# Patient Record
Sex: Male | Born: 1975 | Race: White | Hispanic: No | Marital: Married | Smoking: Current every day smoker
Health system: Southern US, Community
[De-identification: ages and names within clinical notes are randomized; demographics above are authoritative.]

## PROBLEM LIST (undated history)

## (undated) DIAGNOSIS — F419 Anxiety disorder, unspecified: Secondary | ICD-10-CM

## (undated) DIAGNOSIS — IMO0002 Reserved for concepts with insufficient information to code with codable children: Secondary | ICD-10-CM

## (undated) DIAGNOSIS — M1990 Unspecified osteoarthritis, unspecified site: Secondary | ICD-10-CM

## (undated) DIAGNOSIS — I209 Angina pectoris, unspecified: Secondary | ICD-10-CM

## (undated) DIAGNOSIS — K219 Gastro-esophageal reflux disease without esophagitis: Secondary | ICD-10-CM

## (undated) DIAGNOSIS — I10 Essential (primary) hypertension: Secondary | ICD-10-CM

## (undated) HISTORY — DX: Gastro-esophageal reflux disease without esophagitis: K21.9

## (undated) HISTORY — PX: LUMBAR FUSION: SHX111

## (undated) HISTORY — PX: CARDIAC CATHETERIZATION: SHX172

## (undated) HISTORY — PX: BACK SURGERY: SHX140

## (undated) HISTORY — DX: Reserved for concepts with insufficient information to code with codable children: IMO0002

## (undated) HISTORY — PX: SPINE SURGERY: SHX786

---

## 2008-01-28 ENCOUNTER — Emergency Department: Payer: Self-pay | Admitting: Internal Medicine

## 2009-07-20 ENCOUNTER — Other Ambulatory Visit: Payer: Self-pay | Admitting: Family

## 2009-11-12 ENCOUNTER — Emergency Department: Payer: Self-pay | Admitting: Emergency Medicine

## 2010-03-11 ENCOUNTER — Emergency Department: Payer: Self-pay | Admitting: Emergency Medicine

## 2010-04-14 DIAGNOSIS — IMO0002 Reserved for concepts with insufficient information to code with codable children: Secondary | ICD-10-CM

## 2010-04-14 HISTORY — DX: Reserved for concepts with insufficient information to code with codable children: IMO0002

## 2010-08-12 ENCOUNTER — Encounter (HOSPITAL_COMMUNITY)
Admission: RE | Admit: 2010-08-12 | Discharge: 2010-08-12 | Disposition: A | Discharge status: Home / Self Care | Payer: Worker's Compensation | Source: Ambulatory Visit | Attending: Orthopedic Surgery | Admitting: Orthopedic Surgery

## 2010-08-12 ENCOUNTER — Other Ambulatory Visit (HOSPITAL_COMMUNITY): Payer: Self-pay | Admitting: Orthopedic Surgery

## 2010-08-12 ENCOUNTER — Ambulatory Visit (HOSPITAL_COMMUNITY)
Admission: RE | Admit: 2010-08-12 | Discharge: 2010-08-12 | Disposition: A | Discharge status: Home / Self Care | Payer: Worker's Compensation | Source: Ambulatory Visit | Attending: Orthopedic Surgery | Admitting: Orthopedic Surgery

## 2010-08-12 DIAGNOSIS — Z01812 Encounter for preprocedural laboratory examination: Secondary | ICD-10-CM | POA: Insufficient documentation

## 2010-08-12 DIAGNOSIS — Z01818 Encounter for other preprocedural examination: Secondary | ICD-10-CM

## 2010-08-12 DIAGNOSIS — M5137 Other intervertebral disc degeneration, lumbosacral region: Secondary | ICD-10-CM | POA: Insufficient documentation

## 2010-08-12 DIAGNOSIS — M51379 Other intervertebral disc degeneration, lumbosacral region without mention of lumbar back pain or lower extremity pain: Secondary | ICD-10-CM | POA: Insufficient documentation

## 2010-08-12 LAB — CBC
MCV: 87.2 fL (ref 78.0–100.0)
Platelets: 287 10*3/uL (ref 150–400)
RBC: 4.7 MIL/uL (ref 4.22–5.81)
RDW: 12.7 % (ref 11.5–15.5)
WBC: 9.3 10*3/uL (ref 4.0–10.5)

## 2010-08-12 LAB — SURGICAL PCR SCREEN
MRSA, PCR: NEGATIVE
Staphylococcus aureus: NEGATIVE

## 2010-08-14 ENCOUNTER — Observation Stay (HOSPITAL_COMMUNITY)
Admission: RE | Admit: 2010-08-14 | Discharge: 2010-08-15 | Disposition: A | Discharge status: Home / Self Care | Payer: Worker's Compensation | Source: Ambulatory Visit | Attending: Orthopedic Surgery | Admitting: Orthopedic Surgery

## 2010-08-14 ENCOUNTER — Ambulatory Visit (HOSPITAL_COMMUNITY): Payer: Worker's Compensation

## 2010-08-14 DIAGNOSIS — F17200 Nicotine dependence, unspecified, uncomplicated: Secondary | ICD-10-CM | POA: Insufficient documentation

## 2010-08-14 DIAGNOSIS — K219 Gastro-esophageal reflux disease without esophagitis: Secondary | ICD-10-CM | POA: Insufficient documentation

## 2010-08-14 DIAGNOSIS — M5126 Other intervertebral disc displacement, lumbar region: Principal | ICD-10-CM | POA: Insufficient documentation

## 2010-08-21 NOTE — Op Note (Signed)
[name], [name]                  ACCOUNT NO.:  [id number]  MEDICAL RECORD NO.:  [id number]           PATIENT TYPE:  I  LOCATION:  5031                         FACILITY:  [facility]  PHYSICIAN:  Alvy Beal, MD    DATE OF BIRTH:  19-Dec-1975  DATE OF PROCEDURE:  08/14/2010 DATE OF DISCHARGE:                              OPERATIVE REPORT   PREOPERATIVE DIAGNOSES: 1. Lumbar spinal stenosis, L4-5 with left lateral recess. 2. Nerve compression. 3. Disk protrusion.  POSTOPERATIVE DIAGNOSES: 1. Lumbar spinal stenosis, L4-5 with left lateral recess. 2. Nerve compression. 3. Disk protrusion.  OPERATIVE PROCEDURE:  L4-5 hemilaminectomy and foraminotomy L4, with partial diskectomy.  COMPLICATIONS:  None.  CONDITION:  Stable.  HISTORY:  This is a very pleasant 35 year old gentleman who injured himself while at work.  The patient has had persistent back, buttock, and left leg pain for sometime now.  We attempted to treat his pain conservatively consisting of physical therapy, medications, activity modifications, but this failed.  As a result, we elected to proceed with surgery.  All appropriate risks, benefits, and alternatives were discussed.  Consent was obtained.  OPERATIVE NOTE:  The patient was brought to the operating room, placed supine on the operating table.  After successful induction of general anesthesia and endotracheal intubation, TEDs, SCDs were applied.  The patient was turned prone onto the Wilson frame.  All bony prominences well padded.  Back was prepped and draped in standard fashion. Appropriate time-out was done to confirm patient, procedure, and affected extremity.  Once this was done, 2 needles were placed into the back and a preoperative x-ray was done to confirm location for incision. Once this was done, a 1-inch incision was made centered at the L4-5 disk space.  Sharp dissection was carried out down to the deep fascia.  The deep fascia was sharply  incised.  Using a Cobb elevator, mobilized paraspinal muscles to expose the L4 and L5 spinous process and the intervening facet complex.  A Penfield 4 was placed on the L4 lamina and x-ray was taken to confirm that we are at the appropriate level.  Once this was done, I had confirmed that I was at the L4 lamina.  A generous L4 laminotomy was performed.  At this point, I then released the ligamentum flavum and exposed the underlying thecal sac.  I then carried my dissection into the lateral gutter, resecting the bone spur until I could palpate and visualize the medial border of the L5 pedicle.  Once this was done, I knew I had an adequate lateral recess decompression, I then continued into the foramen.  I did resect a portion of the pars in order to get an adequate foraminotomy.  At this point, I then mobilized the thecal sac medially so I could see the disk space.  There was a disk bulge and so I incised the annulus and using a pituitary rongeur as well as Epstein curettes, I removed the bulge and small fragments of disk material.  I then irrigated copiously with normal saline.  I used bipolar electrocautery to obtain hemostasis.  Using a [employer]  elevator, I circumferentially swept underneath the thecal sac, superiorly in the lateral recess, inferiorly in the lateral recess out the foramen.  There was an adequate decompression.  There was no ongoing significant nerve compression.  Given this, I copiously irrigated again with normal saline, placed thrombin-soaked Gelfoam patty over the laminotomy site. I closed in a layered fashion with interrupted 0 Vicryl suture, 2-0 Vicryl suture, and 3-0 Monocryl.  Steri-Strips, dry dressing were applied.  The patient was extubated, transferred to the PACU without incident.  At the end of the case, all needle and sponge counts were correct.     Alvy Beal, MD     DDB/MEDQ  D:  08/14/2010  T:  08/15/2010  Job:  [id number]  cc:   [facility]  [facility]thopedics  Electronically Signed by Venita Lick MD on 08/21/2010 11:47:21 AM

## 2010-11-04 ENCOUNTER — Other Ambulatory Visit (HOSPITAL_COMMUNITY): Payer: Self-pay | Admitting: Orthopedic Surgery

## 2010-11-04 ENCOUNTER — Encounter (HOSPITAL_COMMUNITY)
Admission: RE | Admit: 2010-11-04 | Discharge: 2010-11-04 | Disposition: A | Discharge status: Home / Self Care | Payer: Worker's Compensation | Source: Ambulatory Visit | Attending: Orthopedic Surgery | Admitting: Orthopedic Surgery

## 2010-11-04 ENCOUNTER — Ambulatory Visit (HOSPITAL_COMMUNITY)
Admission: RE | Admit: 2010-11-04 | Discharge: 2010-11-04 | Disposition: A | Discharge status: Home / Self Care | Payer: Worker's Compensation | Source: Ambulatory Visit | Attending: Orthopedic Surgery | Admitting: Orthopedic Surgery

## 2010-11-04 DIAGNOSIS — Z01818 Encounter for other preprocedural examination: Secondary | ICD-10-CM | POA: Insufficient documentation

## 2010-11-04 DIAGNOSIS — M5126 Other intervertebral disc displacement, lumbar region: Secondary | ICD-10-CM

## 2010-11-04 DIAGNOSIS — M519 Unspecified thoracic, thoracolumbar and lumbosacral intervertebral disc disorder: Secondary | ICD-10-CM | POA: Insufficient documentation

## 2010-11-04 DIAGNOSIS — M5146 Schmorl's nodes, lumbar region: Secondary | ICD-10-CM | POA: Insufficient documentation

## 2010-11-04 DIAGNOSIS — Z01812 Encounter for preprocedural laboratory examination: Secondary | ICD-10-CM | POA: Insufficient documentation

## 2010-11-04 LAB — SURGICAL PCR SCREEN: Staphylococcus aureus: NEGATIVE

## 2010-11-04 LAB — DIFFERENTIAL
Basophils Relative: 1 % (ref 0–1)
Eosinophils Absolute: 0.1 10*3/uL (ref 0.0–0.7)
Monocytes Absolute: 0.7 10*3/uL (ref 0.1–1.0)
Monocytes Relative: 9 % (ref 3–12)
Neutrophils Relative %: 56 % (ref 43–77)

## 2010-11-04 LAB — CBC
MCH: 29.3 pg (ref 26.0–34.0)
MCHC: 34 g/dL (ref 30.0–36.0)
Platelets: 321 10*3/uL (ref 150–400)

## 2010-11-06 ENCOUNTER — Inpatient Hospital Stay (HOSPITAL_COMMUNITY)
Admission: RE | Admit: 2010-11-06 | Discharge: 2010-11-07 | DRG: 491 | Disposition: A | Discharge status: Home / Self Care | Payer: Worker's Compensation | Source: Ambulatory Visit | Attending: Orthopedic Surgery | Admitting: Orthopedic Surgery

## 2010-11-06 ENCOUNTER — Inpatient Hospital Stay (HOSPITAL_COMMUNITY): Payer: Worker's Compensation

## 2010-11-06 DIAGNOSIS — M5126 Other intervertebral disc displacement, lumbar region: Principal | ICD-10-CM | POA: Diagnosis present

## 2010-11-17 NOTE — Op Note (Signed)
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  OPERATIVE PROCEDURE:  Revision lumbar L4-5 diskectomy.  COMPLICATIONS:  None.  CONDITION:  Stable.  HISTORY:  This is a very pleasant 35 year old gentleman who in March had an L4-5 disk herniation that was treated surgically.  Initially, he did quite well with resolution of his leg pain.  Couple weeks after his surgery, he was on the commode having a bowel movement, had a significant increase of Valsalva pressure and then had horrific leg pain.  Repeat MRI with contrast demonstrated a recurrent disk herniation at L4-5.  As a result, he returned to see me.  Because of the recurrent disk herniation with horrific left radicular leg, we elected to proceed with surgery.  All appropriate risks, benefits and alternatives were discussed with the patient and consent was obtained.  OPERATIVE NOTE:  The patient was brought to the operating room, placed supine on the operating table.  After successful induction of general anesthesia and endotracheal the patient, TED and SCDs were applied.  The patient was turned prone onto a Wilson frame and all bony prominences well padded.  Back was prepped and draped in a standard fashion. Appropriate time-out was done to confirm patient procedure and affected extremity.  Once this was done, the previous incision was reincised and I sharply dissected down to deep fascia.  I incised the deep fascia on the left side and then mobilized the paraspinal muscles to expose  the L4 and L5 spinous processes and lamina in their entirety.  There was significant scar tissue at the 4/5 interspace.  At this point in order to have better visualization, I used a angled nerve curette to develop a plane underneath the ligamentum the lamina of L4 and extended the laminotomy cranially.  I then went and removed this subtotal L4 inferior facetectomy.  This allowed me to gain access into the lateral recess.  I then identified the medial border of the L4 pedicle and then identified the L5 nerve root.  I then carried my dissection inferiorly creating more room to the L5 nerve root and the lateral recess.  At this point, I visualized a significant disk herniation in the posterior lateral aspect.  Using a nerve hook, I mobilized the disk herniation.  I removed it with a micropituitary rongeur.  I then continued my dissection superiorly until I had good exposure of the lateral of the disk. Because I had widened my laminectomy and facetectomy, I was able to see the posterior lateral aspect of the disk.  I incised this and using Epstein curettes, reduced the annulus and scar tissue into the disk space and then removed it  with a micropituitary rongeur.  At this point, I could clearly take a [facility] L L C and pass it circumferentially at the level of the disk space superiorly in the lateral recess and inferiorly down the lateral recess and out the L5 neural foramen.  There was no undue tension on the nerve root at this point.  At this point with the diskectomy complete and the decompression complete and the nerve root no longer under tension, I irrigated the wound copiously with normal saline, used bone wax to seal the bleeding cancellus edges and bipolar cautery to obtain hemostasis.  The wound was then closed in a layered fashion with interrupted #1 Vicryl sutures, 2-0 Vicryl sutures and a 3-0 Monocryl.  Steri-Strips and dry dressing were applied.  The patient was extubated and  transferred to PACU without incident.  At the end of the case; all needle and sponge counts were correct.     Alvy Beal, MD     DDB/MEDQ  D:  11/06/2010  T:  11/07/2010  Job:  [id number]  Electronically Signed by Venita Lick MD on 11/17/2010 12:02:25 AM

## 2011-06-30 ENCOUNTER — Other Ambulatory Visit: Payer: Self-pay | Admitting: Orthopedic Surgery

## 2011-06-30 DIAGNOSIS — M545 Low back pain: Secondary | ICD-10-CM

## 2011-07-03 ENCOUNTER — Ambulatory Visit
Admission: RE | Admit: 2011-07-03 | Discharge: 2011-07-03 | Disposition: A | Discharge status: Home / Self Care | Payer: Worker's Compensation | Source: Ambulatory Visit | Attending: Orthopedic Surgery | Admitting: Orthopedic Surgery

## 2011-07-03 DIAGNOSIS — M545 Low back pain: Secondary | ICD-10-CM

## 2011-07-30 ENCOUNTER — Encounter (HOSPITAL_COMMUNITY): Payer: Self-pay | Admitting: Pharmacy Technician

## 2011-08-03 ENCOUNTER — Encounter: Payer: Self-pay | Admitting: Vascular Surgery

## 2011-08-03 ENCOUNTER — Other Ambulatory Visit: Payer: Self-pay | Admitting: *Deleted

## 2011-08-04 ENCOUNTER — Encounter: Payer: Self-pay | Admitting: Vascular Surgery

## 2011-08-05 ENCOUNTER — Ambulatory Visit (INDEPENDENT_AMBULATORY_CARE_PROVIDER_SITE_OTHER): Payer: Worker's Compensation | Admitting: Vascular Surgery

## 2011-08-05 ENCOUNTER — Encounter: Payer: Self-pay | Admitting: Vascular Surgery

## 2011-08-05 VITALS — BP 109/64 | HR 83 | Resp 16 | Ht 71.0 in | Wt 155.0 lb

## 2011-08-05 DIAGNOSIS — M48061 Spinal stenosis, lumbar region without neurogenic claudication: Secondary | ICD-10-CM | POA: Insufficient documentation

## 2011-08-05 NOTE — Progress Notes (Signed)
[facility] [place]  Patient name: [name] MRN: [id number] DOB: 07-Jan-1976 Sex: male  REASON FOR CONSULT: evaluate for anterior retroperitoneal exposure of L4-S1. Referred by Dr. Shon Baton.  HPI: [name] is a 36 y.o. male who injured his back at work on 04/14/2010. He was lifting a heavy spool of wire and developed sudden back pain. He continued to work and his pain worsened. He's had back pain ever since that time. He's had significant degenerative disc disease and has undergone previous discectomy. He's had recurrent symptoms and recurrent problems in his back. He scheduled for stabilization of his back by Dr. Shon Baton on 08/13/2011 and I was asked to provide anterior retroperitoneal exposure of L4 to S1.  He has tried physical therapy, pain medicine, and injection therapy without relief. Symptoms are aggravated by any activity with really no alleviating factors.  Past Medical History  Diagnosis Date  . GERD (gastroesophageal reflux disease)   . Spine injury 04-14-2010    Workmen's comp injury date    Family History  Problem Relation Age of Onset  . Heart disease Father     Heart Disease before age 68  . Hyperlipidemia Father   . Hypertension Father   . Heart attack Father   . Cancer Sister     SOCIAL HISTORY: History  Substance Use Topics  . Smoking status: Current Everyday Smoker -- 1.0 packs/day  . Smokeless tobacco: Not on file  . Alcohol Use: No    Allergies  Allergen Reactions  . Penicillins     Current Outpatient Prescriptions  Medication Sig Dispense Refill  . Multiple Vitamin (MULITIVITAMIN WITH MINERALS) TABS Take 1 tablet by mouth daily.      Marland Kitchen oxyCODONE (OXY IR/ROXICODONE) 5 MG immediate release tablet Take 5 mg by mouth every 6 (six) hours as needed. For pain      . oxyCODONE (OXYCONTIN) 40 MG 12 hr tablet Take 40 mg by mouth every 12 (twelve) hours.      . polyethylene glycol (MIRALAX / GLYCOLAX) packet Take 17 g by mouth every  other day as needed. For constipations        REVIEW OF SYSTEMS: Arly.Keller ] denotes positive finding; [  ] denotes negative finding  CARDIOVASCULAR:  [ ]  chest pain   [ ]  chest pressure   [ ]  palpitations   [ ]  orthopnea   [ ]  dyspnea on exertion   [ ]  claudication   [ ]  rest pain   [ ]  DVT   [ ]  phlebitis PULMONARY:   [ ]  productive cough   [ ]  asthma   [ ]  wheezing NEUROLOGIC:   Arly.Keller ] weakness left leg Arly.Keller ] paresthesias left leg [ ]  aphasia  [ ]  amaurosis  [ ]  dizziness HEMATOLOGIC:   [ ]  bleeding problems   [ ]  clotting disorders MUSCULOSKELETAL:  [ ]  joint pain   [ ]  joint swelling [ ]  leg swelling GASTROINTESTINAL: [ ]   blood in stool  [ ]   hematemesis GENITOURINARY:  [ ]   dysuria  [ ]   hematuria PSYCHIATRIC:  [ ]  history of major depression INTEGUMENTARY:  [ ]  rashes  [ ]  ulcers CONSTITUTIONAL:  [ ]  fever   [ ]  chills  PHYSICAL EXAM: Filed Vitals:   08/05/11 1404  BP: 109/64  Pulse: 83  Resp: 16  Height: 5\' 11"  (1.803 m)  Weight: 155 lb (70.308 kg)  SpO2: 100%   Body mass index is 21.62 kg/(m^2). GENERAL: The patient  is a well-nourished male, in no acute distress. The vital signs are documented above. CARDIOVASCULAR: There is a regular rate and rhythm without significant murmur appreciated. I do not detect carotid bruits. He has palpable femoral pulses and palpable dorsalis pedis and posterior tibial pulses bilaterally. PULMONARY: There is good air exchange bilaterally without wheezing or rales. ABDOMEN: Soft and non-tender with normal pitched bowel sounds.  MUSCULOSKELETAL: There are no major deformities or cyanosis. NEUROLOGIC: No focal weakness or paresthesias are detected. SKIN: There are no ulcers or rashes noted. PSYCHIATRIC: The patient has a normal affect.  DATA: I have reviewed his CT of the lumbar spine. The radiologist's interpretation as noted below. 1. Superior endplate Schmorl's node at L3-4.  2. Right posterolateral annular tear at L3-4 with slight disc  bulging  but no significant stenosis.  3. Diffuse annular degeneration at L4-5.  4. Leak of contrast into the left lateral recess and epidural  space. This may, this may contact of the previous microdiskectomy  given a left-sided laminectomy defect.  5. Right posterolateral disc protrusion and annular tear at L5-S1  with mild right lateral recess and foraminal narrowing.  I have reviewed the films myself. He does has some calcium in both common iliac arteries it is fairly focal.  MEDICAL ISSUES: He appears to be a reasonable candidate for anterior retroperitoneal exposure of L4-S1. I have reviewed our role in exposure of the spine in order to allow anterior lumbar interbody fusion at the appropriate levels. We have discussed the potential complications of surgery, including but not limited to, arterial or venous injury, thrombosis, or bleeding. We have also discussed the potential risks of wound healing problems, the development of a hernia, nerve injury, leg swelling, or other unpredictable medical problems. I've also explained that for the L5-S1 level there is a small risk of retrograde ejaculation. 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 All the patient's questions were answered and they are agreeable to proceed.   Murphy Duzan S [facility] [place] Beeper: [phone]

## 2011-08-10 ENCOUNTER — Encounter (HOSPITAL_COMMUNITY)
Admission: RE | Admit: 2011-08-10 | Discharge: 2011-08-10 | Disposition: A | Discharge status: Home / Self Care | Payer: Worker's Compensation | Source: Ambulatory Visit | Attending: Orthopedic Surgery | Admitting: Orthopedic Surgery

## 2011-08-10 ENCOUNTER — Ambulatory Visit (HOSPITAL_COMMUNITY)
Admission: RE | Admit: 2011-08-10 | Discharge: 2011-08-10 | Disposition: A | Discharge status: Home / Self Care | Payer: Worker's Compensation | Source: Ambulatory Visit | Attending: Physician Assistant | Admitting: Physician Assistant

## 2011-08-10 ENCOUNTER — Encounter (HOSPITAL_COMMUNITY): Payer: Self-pay

## 2011-08-10 ENCOUNTER — Other Ambulatory Visit: Payer: Self-pay

## 2011-08-10 DIAGNOSIS — Z87891 Personal history of nicotine dependence: Secondary | ICD-10-CM | POA: Insufficient documentation

## 2011-08-10 DIAGNOSIS — Z01810 Encounter for preprocedural cardiovascular examination: Secondary | ICD-10-CM | POA: Insufficient documentation

## 2011-08-10 DIAGNOSIS — Z01818 Encounter for other preprocedural examination: Secondary | ICD-10-CM | POA: Insufficient documentation

## 2011-08-10 DIAGNOSIS — Z01812 Encounter for preprocedural laboratory examination: Secondary | ICD-10-CM | POA: Insufficient documentation

## 2011-08-10 HISTORY — DX: Unspecified osteoarthritis, unspecified site: M19.90

## 2011-08-10 LAB — TYPE AND SCREEN: Antibody Screen: NEGATIVE

## 2011-08-10 LAB — COMPREHENSIVE METABOLIC PANEL
ALT: 18 U/L (ref 0–53)
AST: 19 U/L (ref 0–37)
Calcium: 9.7 mg/dL (ref 8.4–10.5)
GFR calc Af Amer: >90 mL/min (ref >90)
Sodium: 139 mEq/L (ref 135–145)
Total Protein: 7.3 g/dL (ref 6.0–8.3)

## 2011-08-10 LAB — CBC
MCH: 28.8 pg (ref 26.0–34.0)
MCHC: 33.8 g/dL (ref 30.0–36.0)
Platelets: 309 10*3/uL (ref 150–400)

## 2011-08-10 LAB — DIFFERENTIAL
Eosinophils Relative: 2 % (ref 0–5)
Lymphocytes Relative: 31 % (ref 12–46)
Lymphs Abs: 2.7 10*3/uL (ref 0.7–4.0)
Monocytes Absolute: 0.5 10*3/uL (ref 0.1–1.0)

## 2011-08-10 LAB — ABO/RH: ABO/RH(D): A POS

## 2011-08-10 LAB — PROTIME-INR: Prothrombin Time: 13.9 seconds (ref 11.6–15.2)

## 2011-08-10 NOTE — H&P (Signed)
[name] 08/10/2011 10:14 AM [facility]: [facility] Patient #: [id number] WC DOB: 17-Dec-1975 Single / Language: [place] / Race: White Male   History of Present Illness(Daniel Cyphers J Marcelline Mates, PA-C; 08/10/2011 11:00 AM) The patient is a 36 year old male who comes in today for a preoperative History and Physical. The patient is scheduled for a ALIF/PSFI L4-S1 with revision decompression (for failed back syndrome; constant low back pain with ocassional left leg pain) to be performed by Dr. Debria Garret D. Shon Baton, MD at [facility] on [phone] at 730am . Please see the [facility] record for complete dictated history and physical.    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 11/11/2010   Allergies(Daniel Salzwedel J Zaidin Blyden, PA-C; 08/10/2011 10:34 AM) Robley Fries   Family History([name]; 08/10/2011 10:19 AM) Cancer. grandmother fathers side Chronic Obstructive Lung Disease. grandmother mothers side Heart Disease. father Heart disease in male family member before age 69 Hypertension. father Osteoarthritis. grandmother mothers side   Social History([name]; 08/10/2011 10:19 AM) Alcohol use. Formerly drank alcohol. current drinker; 15 or more per week Children. 0 Current work status. disabled Drug/Alcohol Rehab (Currently). no Exercise. Exercises never Illicit drug use. no Living situation. live with partner Marital status. partnered Most recent primary occupation. electrician Pain Contract. no Previously in rehab. no Tobacco / smoke exposure. no Tobacco use. Current every day smoker. 1 ppd current every day smoker; smoke(d) 3 or more pack(s) per day; uses less than half 1/2 can(s) smokeless per week   Medication  History([name]; 08/10/2011 10:20 AM) OxyCONTIN (40MG  Tablet ER 12HR, 1 Oral q 12 hrs, Taken starting 08/07/2011) Active. OxyCODONE HCl (5MG  Capsule, 1 Oral q 6 hours prn pain, Taken starting 07/22/2011) Active. MiraLax ( Oral) Active. (prn)   Pregnancy / Birth History([name]; 08/10/2011 10:19 AM) Pregnant. no   Past Surgical History(Daniel Cercone J [facility], PA-C; 08/10/2011 10:34 AM) Spinal Surgery. x2 Low Back Disc Surgery   Other Problems(Daniel Olguin J [facility], PA-C; 08/10/2011 10:34 AM) Chronic Pain Unspecified Diagnosis   Review of Systems([name]; 08/10/2011 10:19 AM) General:Not Present- Chills, Fever, Night Sweats, Appetite Loss, Fatigue, Feeling sick, Weight Gain and Weight Loss. Skin:Not Present- Itching, Rash, Skin Color Changes, Ulcer, Psoriasis and Change in Hair or Nails. HEENT:Not Present- Sensitivity to light, Nose Bleed, Visual Loss, Decreased Hearing and Ringing in the Ears. Neck:Not Present- Swollen Glands and Neck Mass. Respiratory:Not Present- Shortness of breath, Snoring, Chronic Cough and Bloody sputum. Cardiovascular:Not Present- Shortness of Breath, Chest Pain, Swelling of Extremities, Leg Cramps and Palpitations. Gastrointestinal:Not Present- Bloody Stool, Heartburn, Abdominal Pain, Vomiting, Nausea and Incontinence of Stool. Male Genitourinary:Not Present- Blood in Urine, Frequency, Incontinence and Nocturia. Musculoskeletal:Present- Back Pain. Not Present- Muscle Weakness, Muscle Pain, Joint Stiffness, Joint Swelling and Joint Pain. Neurological:Present- Tingling, Numbness and Burning. Not Present- Tremor, Headaches and Dizziness. Psychiatric:Not Present- Anxiety, Depression and Memory Loss. Endocrine:Not Present- Cold Intolerance, Heat Intolerance, Excessive hunger and Excessive Thirst. Hematology:Not Present- Abnormal Bleeding, Abnormal bruising, Anemia and Blood Clots.   Vitals([name]; 08/10/2011 10:21 AM) 08/10/2011 10:20 AM Weight:  153 lb Height: 71 in Body Surface Area: 1.86 m Body Mass Index: 21.34 kg/m Pulse: 98 (Regular) BP: 126/92 (Sitting, Left Arm, Standard)    Physical Exam(Daniel Hubble J Gabbriella Presswood, PA-C; 08/10/2011 12:42 PM) The physical exam findings are as follows:   General General Appearance- pleasant.  Not in acute distress. Orientation- Oriented X3. Build & Nutrition- Well nourished and Well developed. Posture- Normal posture. Gait- Normal. Mental Status- Alert.   Integumentary Lumbar Spine- Skin examination of the lumbar spine is without deformity, skin lesions, lacerations or abrasions.   Head and Neck Neck Global Assessment- supple. no lymphadenopathy and no nucchal rigidty.   Eye Pupil- Bilateral- Normal, Direct reaction to light normal, Equal and Regular. Motion- Bilateral- EOMI.   Chest and Lung Exam Auscultation: Breath sounds:- Clear.   Cardiovascular Auscultation:Rhythm- Regular rate and rhythm. Heart Sounds- Normal heart sounds.   Abdomen Palpation/Percussion:Palpation and Percussion of the abdomen reveal - Non Tender, No Rebound tenderness and Soft.   Peripheral Vascular Lower Extremity:Inspection- Bilateral- Inspection Normal. Palpation:Posterior tibial pulse- Bilateral- 2+. Dorsalis pedis pulse- Bilateral- 2+.   Neurologic Sensation:Lower Extremity- Bilateral- sensation is intact in the lower extremity. Reflexes:Patellar Reflex- Bilateral- 1+. Achilles Reflex- Bilateral- 1+. Babinski- Bilateral- Babinski not present. Clonus- Bilateral- clonus not present. Hoffman's Sign- Bilateral- Hoffman's sign not present. Testing:Seated Straight Leg Raise- Bilateral- Seated straight leg raise negative.   Musculoskeletal Spine/Ribs/Pelvis Lumbosacral Spine:Inspection and Palpation- Tenderness- generalized. bony and soft tissue palpation of the lumbar spine and SI joint does not recreate their typical pain. Strength and  Tone: Strength:Hip Flexion- Bilateral- 5/5. Knee Extension- Bilateral- 5/5. Knee Flexion- Bilateral- 5/5. Ankle Dorsiflexion- Bilateral- 5/5. Ankle Plantarflexion- Bilateral- 5/5. Heel walk- Bilateral- able to heel walk without difficulty. Toe Walk- Bilateral- able to walk on toes without difficulty. Heel-Toe Walk- Bilateral- able to heel-toe walk without difficulty. ROM- Flexion- moderately decreased range of motion and painful. Extension- moderately decreased range of motion and painful. Pain:- neither flexion or extension is more painful than the other. Waddell's Signs- no Waddell's signs present. Lower Extremity Range of Motion:- No true hip, knee or ankle pain with range of motion. Gait and Station:Assistive Devices- cane (patient ambulates with the use of a cane but is able to ambulate independent of the cane for the exam today. he does so with a limp.).   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 Risks/benefits/alternatives to surgery/expectations following surgery have been discussed with the patient by Dr. Shon Baton.  CT of the lumbar spine dated 07/02/14 demonstrates diffuse annular degeneration L4-5 with leak of contrast into the left lateral recess and epidural space and right posterolateral disc protrusion and annular tear L5-S1 with mild right lateral recess and foraminal narrowing.  Discogram dated 07/03/11 demonstrates pain at L4-5 and a chemically sensative disc at L5-S1 without reproduction of lower limb pain.  Patient has been evaluated by and has discussed surgery with Dr. Durwin Nora. He's scheduled to  complete his pre-op [facility] requirements later this afternoon at [facility]. He has been fitted for lumbar corset brace. He has recieved the bone stimulator. He knows to bring both of these with him to the [facility] the day of surgery.   All of his questions have been encouraged, addressed and answered. Plan, at this time is to proceed with surgery ar scheduled.   Signed electronically by Gwinda Maine, PA-C (08/10/2011 12:43 PM)  Marjorie Smolder 07/22/2011 10:45 AM Location: [facility] Patient #: [id number] WC DOB: 28-Aug-1975 Single / Language: [place] / Race: White Male   History of  Present Illness(Jo Leslie Dales; 07/22/2011 10:47 AM) The patient is a 35 year old male who presents today for follow up of their back. The patient is being followed for their post-laminectomy syndrome. Symptoms reported today include: pain. The patient states that they are doing poorly. The following medication has been used for pain control: Oxycontin 40mg . The patient reports their current pain level to be 8 / 10. The patient presents today following CT scan and myelogram.    Subjective Transcription(DAHARI Sheela Stack, MD; 07/27/2011 3:44 PM)  He returns today for follow up. Unfortunately he continues to have significant back, buttock and left leg pain. His discogram confirmed concordant pain at L4-5 and L5-S1. The patient has failed back syndrome with two previous lumbar discectomies. He still has neuropathic leg pain.    Allergies(Jo Leslie Dales; 07/22/2011 10:47 AM) Robley Fries   Social History(Jo Leslie Dales; 07/22/2011 10:47 AM) Tobacco use. Current every day smoker. 1 ppd Alcohol use. Formerly drank alcohol.   Medication History(Jo Leslie Dales; 07/22/2011 10:47 AM) Kyra Searles (40MG  Tablet ER 12HR, 1 Oral q 12 hrs, Taken starting 07/14/2011) Active.   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   Plans Transcription(DAHARI D BROOKS, MD; 07/27/2011 3:44 PM)  At this point in time the only option I have for him would be to do a lumbar fusion procedure. Given the fact that he smokes and that he has two level discogenic back pain reproduced on discography I would recommend an anterior L4-5, L5-S1 ALIF. This would allow me to completely resect the painful disc and put a large cage in. I would then do a posterior pedicle screw instrumentation at L4-5 and L5-S1 to increase the overall stability, the construct and to ensure the best opportunity of a fusion. In addition I could also do a direct decompression at L5-S1 and revision decompression at L4-5 to ensure that that left L4, L5 and S1 nerve roots are completely decompressed. I reviewed this with the patient and his wife. All of their questions were addressed. The risks include infection, bleeding, nerve damage, death, stroke, paralysis, retrograde ejaculation, need for further surgery, ongoing or worse pain, leak of spinal fluid, blood clots and bleeding. At this point in time given the failure of conservative management he would like to proceed with surgery which I think is reasonable. I will go ahead and set up the surgery for the near future and also restart his break through pain medication which would be 5 mg q six p.r.n. break through pain.    Miscellaneous Transcription(DAHARI Sheela Stack, MD; 07/27/2011 3:44 PM)  Alvy Beal, MD/MMK    T: 07/27/11  D: 07/22/11      Signed electronically by Alvy Beal, MD (07/27/2011 3:51 PM)

## 2011-08-10 NOTE — Pre-Procedure Instructions (Signed)
20 [name]  08/10/2011   Your procedure is scheduled on:  08/13/2011  Report to [facility] at 5:30 AM.  Call this number if you have problems the morning of surgery: [phone]   Remember:   Do not eat food:After Midnight.  May have clear liquids: up to 4 Hours before arrival.  Clear liquids include soda, tea, black coffee, apple or grape juice, broth.  Take these medicines the morning of surgery with A SIP OF WATER:PAIN medicine as needed   Do not wear jewelry, make-up or nail polish.  Do not wear lotions, powders, or perfumes. You may wear deodorant.  Do not shave 48 hours prior to surgery.  Do not bring valuables to the hospital.  Contacts, dentures or bridgework may not be worn into surgery.  Leave suitcase in the car. After surgery it may be brought to your room.  For patients admitted to the hospital, checkout time is 11:00 AM the day of discharge.   Patients discharged the day of surgery will not be allowed to drive home.  Name and phone number of your driver: with Debbie  Special Instructions: CHG Shower Use Special Wash: 1/2 bottle night before surgery and 1/2 bottle morning of surgery.   Please read over the following fact sheets that you were given: Pain Booklet, Coughing and Deep Breathing, Blood Transfusion Information, MRSA Information and Surgical Site Infection Prevention

## 2011-08-12 MED ORDER — LACTATED RINGERS IV SOLN: INTRAVENOUS | Status: DC

## 2011-08-12 MED ORDER — VANCOMYCIN HCL IN DEXTROSE 1-5 GM/200ML-% IV SOLN
1000.0000 mg | INTRAVENOUS | Status: AC
  Administered 2011-08-13: 1000 mg via INTRAVENOUS
  Filled 2011-08-12: qty 200

## 2011-08-13 ENCOUNTER — Ambulatory Visit (HOSPITAL_COMMUNITY): Payer: Worker's Compensation

## 2011-08-13 ENCOUNTER — Encounter (HOSPITAL_COMMUNITY): Payer: Self-pay | Admitting: Anesthesiology

## 2011-08-13 ENCOUNTER — Ambulatory Visit (HOSPITAL_COMMUNITY): Payer: Worker's Compensation | Admitting: Anesthesiology

## 2011-08-13 ENCOUNTER — Encounter (HOSPITAL_COMMUNITY)
Admission: RE | Disposition: A | Discharge status: Home Health | Payer: Self-pay | Source: Ambulatory Visit | Attending: Orthopedic Surgery

## 2011-08-13 ENCOUNTER — Inpatient Hospital Stay (HOSPITAL_COMMUNITY)
Admission: RE | Admit: 2011-08-13 | Discharge: 2011-08-15 | DRG: 460 | Disposition: A | Discharge status: Home Health | Payer: Worker's Compensation | Source: Ambulatory Visit | Attending: Orthopedic Surgery | Admitting: Orthopedic Surgery

## 2011-08-13 DIAGNOSIS — M48061 Spinal stenosis, lumbar region without neurogenic claudication: Secondary | ICD-10-CM

## 2011-08-13 DIAGNOSIS — M5137 Other intervertebral disc degeneration, lumbosacral region: Principal | ICD-10-CM | POA: Diagnosis present

## 2011-08-13 DIAGNOSIS — G894 Chronic pain syndrome: Secondary | ICD-10-CM | POA: Diagnosis present

## 2011-08-13 DIAGNOSIS — K219 Gastro-esophageal reflux disease without esophagitis: Secondary | ICD-10-CM | POA: Diagnosis present

## 2011-08-13 DIAGNOSIS — M961 Postlaminectomy syndrome, not elsewhere classified: Secondary | ICD-10-CM | POA: Diagnosis present

## 2011-08-13 DIAGNOSIS — Z880 Allergy status to penicillin: Secondary | ICD-10-CM

## 2011-08-13 DIAGNOSIS — F17200 Nicotine dependence, unspecified, uncomplicated: Secondary | ICD-10-CM | POA: Diagnosis present

## 2011-08-13 DIAGNOSIS — M51379 Other intervertebral disc degeneration, lumbosacral region without mention of lumbar back pain or lower extremity pain: Principal | ICD-10-CM | POA: Diagnosis present

## 2011-08-13 DIAGNOSIS — M5126 Other intervertebral disc displacement, lumbar region: Secondary | ICD-10-CM | POA: Diagnosis present

## 2011-08-13 DIAGNOSIS — M549 Dorsalgia, unspecified: Secondary | ICD-10-CM

## 2011-08-13 HISTORY — PX: ANTERIOR AND POSTERIOR SPINAL FUSION: SHX2259

## 2011-08-13 SURGERY — ANTERIOR AND POSTERIOR SPINAL FUSION
Anesthesia: General | Site: Abdomen | Wound class: Clean

## 2011-08-13 MED ORDER — MIDAZOLAM HCL 5 MG/5ML IJ SOLN
INTRAMUSCULAR | Status: DC | PRN
  Administered 2011-08-13: 1 mg via INTRAVENOUS
  Administered 2011-08-13: 2 mg via INTRAVENOUS
  Administered 2011-08-13: 1 mg via INTRAVENOUS

## 2011-08-13 MED ORDER — VANCOMYCIN HCL IN DEXTROSE 1-5 GM/200ML-% IV SOLN
1000.0000 mg | Freq: Two times a day (BID) | INTRAVENOUS | Status: DC
  Filled 2011-08-13 (×2): qty 200

## 2011-08-13 MED ORDER — 0.9 % SODIUM CHLORIDE (POUR BTL) OPTIME
TOPICAL | Status: DC | PRN
  Administered 2011-08-13: 1000 mL

## 2011-08-13 MED ORDER — ALBUMIN HUMAN 5 % IV SOLN
INTRAVENOUS | Status: DC | PRN
  Administered 2011-08-13 (×2): via INTRAVENOUS

## 2011-08-13 MED ORDER — VANCOMYCIN HCL IN DEXTROSE 1-5 GM/200ML-% IV SOLN
1000.0000 mg | Freq: Two times a day (BID) | INTRAVENOUS | Status: AC
  Administered 2011-08-13 – 2011-08-14 (×2): 1000 mg via INTRAVENOUS
  Filled 2011-08-13 (×2): qty 200

## 2011-08-13 MED ORDER — ACETAMINOPHEN 10 MG/ML IV SOLN
1000.0000 mg | Freq: Once | INTRAVENOUS | Status: AC
  Administered 2011-08-13 (×2): 1000 mg via INTRAVENOUS

## 2011-08-13 MED ORDER — SUFENTANIL CITRATE 50 MCG/ML IV SOLN
INTRAVENOUS | Status: DC | PRN
  Administered 2011-08-13 (×15): 10 ug via INTRAVENOUS

## 2011-08-13 MED ORDER — POLYETHYLENE GLYCOL 3350 17 G PO PACK: 17.0000 g | PACK | ORAL | Status: DC | PRN

## 2011-08-13 MED ORDER — ACETAMINOPHEN 10 MG/ML IV SOLN: INTRAVENOUS | Status: DC | PRN

## 2011-08-13 MED ORDER — HYDROMORPHONE HCL PF 1 MG/ML IJ SOLN
INTRAMUSCULAR | Status: DC | PRN
  Administered 2011-08-13 (×2): 1 mg via INTRAVENOUS

## 2011-08-13 MED ORDER — ONDANSETRON HCL 4 MG/2ML IJ SOLN: 4.0000 mg | Freq: Four times a day (QID) | INTRAMUSCULAR | Status: DC | PRN

## 2011-08-13 MED ORDER — OXYCODONE HCL 5 MG PO TABS
10.0000 mg | ORAL_TABLET | ORAL | Status: DC | PRN
  Administered 2011-08-14 – 2011-08-15 (×9): 10 mg via ORAL
  Filled 2011-08-13 (×9): qty 2

## 2011-08-13 MED ORDER — PROMETHAZINE HCL 25 MG/ML IJ SOLN: 6.2500 mg | INTRAMUSCULAR | Status: DC | PRN

## 2011-08-13 MED ORDER — MORPHINE SULFATE (PF) 1 MG/ML IV SOLN
INTRAVENOUS | Status: AC
  Filled 2011-08-13: qty 25

## 2011-08-13 MED ORDER — BUPIVACAINE-EPINEPHRINE 0.25% -1:200000 IJ SOLN
INTRAMUSCULAR | Status: DC | PRN
  Administered 2011-08-13: 10 mL

## 2011-08-13 MED ORDER — ACETAMINOPHEN 10 MG/ML IV SOLN
INTRAVENOUS | Status: AC
  Filled 2011-08-13: qty 100

## 2011-08-13 MED ORDER — PROPOFOL 10 MG/ML IV EMUL
INTRAVENOUS | Status: DC | PRN
  Administered 2011-08-13: 25 ug/kg/min via INTRAVENOUS

## 2011-08-13 MED ORDER — DEXAMETHASONE SODIUM PHOSPHATE 10 MG/ML IJ SOLN: 10.0000 mg | Freq: Once | INTRAMUSCULAR | Status: DC

## 2011-08-13 MED ORDER — OXYCODONE HCL 40 MG PO TB12
40.0000 mg | ORAL_TABLET | Freq: Two times a day (BID) | ORAL | Status: DC
  Administered 2011-08-14 – 2011-08-15 (×3): 40 mg via ORAL
  Filled 2011-08-13 (×3): qty 1

## 2011-08-13 MED ORDER — ARTIFICIAL TEARS OP OINT
TOPICAL_OINTMENT | OPHTHALMIC | Status: DC | PRN
  Administered 2011-08-13: 1 via OPHTHALMIC

## 2011-08-13 MED ORDER — DEXAMETHASONE SODIUM PHOSPHATE 4 MG/ML IJ SOLN
INTRAMUSCULAR | Status: DC | PRN
  Administered 2011-08-13: 10 mg via INTRAVENOUS

## 2011-08-13 MED ORDER — MEPERIDINE HCL 25 MG/ML IJ SOLN: 6.2500 mg | INTRAMUSCULAR | Status: DC | PRN

## 2011-08-13 MED ORDER — MORPHINE SULFATE (PF) 1 MG/ML IV SOLN
INTRAVENOUS | Status: DC
  Administered 2011-08-13 – 2011-08-14 (×2): via INTRAVENOUS
  Administered 2011-08-14: 15 mg via INTRAVENOUS
  Administered 2011-08-14: 24 mg via INTRAVENOUS

## 2011-08-13 MED ORDER — SODIUM CHLORIDE 0.9 % IV SOLN
INTRAVENOUS | Status: DC | PRN
  Administered 2011-08-13: 13:00:00 via INTRAVENOUS

## 2011-08-13 MED ORDER — LACTATED RINGERS IV SOLN
INTRAVENOUS | Status: DC
  Administered 2011-08-14 (×2): via INTRAVENOUS

## 2011-08-13 MED ORDER — PROPOFOL 10 MG/ML IV EMUL
INTRAVENOUS | Status: DC | PRN
  Administered 2011-08-13: 200 mg via INTRAVENOUS

## 2011-08-13 MED ORDER — DIPHENHYDRAMINE HCL 12.5 MG/5ML PO ELIX: 12.5000 mg | ORAL_SOLUTION | Freq: Four times a day (QID) | ORAL | Status: DC | PRN

## 2011-08-13 MED ORDER — ROCURONIUM BROMIDE 100 MG/10ML IV SOLN
INTRAVENOUS | Status: DC | PRN
  Administered 2011-08-13: 50 mg via INTRAVENOUS
  Administered 2011-08-13: 20 mg via INTRAVENOUS
  Administered 2011-08-13 (×2): 10 mg via INTRAVENOUS
  Administered 2011-08-13: 20 mg via INTRAVENOUS
  Administered 2011-08-13: 10 mg via INTRAVENOUS
  Administered 2011-08-13: 50 mg via INTRAVENOUS
  Administered 2011-08-13 (×3): 10 mg via INTRAVENOUS

## 2011-08-13 MED ORDER — HEMOSTATIC AGENTS (NO CHARGE) OPTIME
TOPICAL | Status: DC | PRN
  Administered 2011-08-13: 1 via TOPICAL

## 2011-08-13 MED ORDER — THROMBIN 20000 UNITS EX KIT
PACK | CUTANEOUS | Status: DC | PRN
  Administered 2011-08-13: 08:00:00 via TOPICAL

## 2011-08-13 MED ORDER — DIPHENHYDRAMINE HCL 50 MG/ML IJ SOLN: 12.5000 mg | Freq: Four times a day (QID) | INTRAMUSCULAR | Status: DC | PRN

## 2011-08-13 MED ORDER — VECURONIUM BROMIDE 10 MG IV SOLR
INTRAVENOUS | Status: DC | PRN
  Administered 2011-08-13: 1 mg via INTRAVENOUS
  Administered 2011-08-13: 2 mg via INTRAVENOUS

## 2011-08-13 MED ORDER — HETASTARCH-ELECTROLYTES 6 % IV SOLN
INTRAVENOUS | Status: DC | PRN
  Administered 2011-08-13: 12:00:00 via INTRAVENOUS

## 2011-08-13 MED ORDER — LACTATED RINGERS IV SOLN
INTRAVENOUS | Status: DC | PRN
  Administered 2011-08-13 (×3): via INTRAVENOUS

## 2011-08-13 MED ORDER — NEOSTIGMINE METHYLSULFATE 1 MG/ML IJ SOLN
INTRAMUSCULAR | Status: DC | PRN
  Administered 2011-08-13: 1 mg via INTRAVENOUS

## 2011-08-13 MED ORDER — LACTATED RINGERS IV SOLN: INTRAVENOUS | Status: DC

## 2011-08-13 MED ORDER — NALOXONE HCL 0.4 MG/ML IJ SOLN: 0.4000 mg | INTRAMUSCULAR | Status: DC | PRN

## 2011-08-13 MED ORDER — LACTATED RINGERS IV SOLN
INTRAVENOUS | Status: DC | PRN
  Administered 2011-08-13 (×2): via INTRAVENOUS

## 2011-08-13 MED ORDER — SODIUM CHLORIDE 0.9 % IJ SOLN: 9.0000 mL | INTRAMUSCULAR | Status: DC | PRN

## 2011-08-13 MED ORDER — ACETAMINOPHEN 10 MG/ML IV SOLN
1000.0000 mg | Freq: Four times a day (QID) | INTRAVENOUS | Status: AC
  Administered 2011-08-13 – 2011-08-14 (×4): 1000 mg via INTRAVENOUS
  Filled 2011-08-13 (×4): qty 100

## 2011-08-13 MED ORDER — ENOXAPARIN SODIUM 40 MG/0.4ML ~~LOC~~ SOLN
40.0000 mg | SUBCUTANEOUS | Status: DC
  Administered 2011-08-14 – 2011-08-15 (×2): 40 mg via SUBCUTANEOUS
  Filled 2011-08-13 (×3): qty 0.4

## 2011-08-13 MED ORDER — ONDANSETRON HCL 4 MG/2ML IJ SOLN
INTRAMUSCULAR | Status: DC | PRN
  Administered 2011-08-13: 4 mg via INTRAVENOUS

## 2011-08-13 MED ORDER — HYDROMORPHONE HCL PF 1 MG/ML IJ SOLN: 0.2500 mg | INTRAMUSCULAR | Status: DC | PRN

## 2011-08-13 SURGICAL SUPPLY — 109 items
6.5x25mm screw ×2 IMPLANT
ALIF w/scres 40x27x13mm ×2 IMPLANT
ALIF w/screws 40x27x14mm ×2 IMPLANT
APPLIER CLIP 11 MED OPEN (CLIP) ×4
BIT DRILL QC 3.2 195 (BIT)
BIT DRILL QC 3.2 195MM (BIT) IMPLANT
BLADE SURG 10 STRL SS (BLADE) ×2 IMPLANT
BLADE SURG ROTATE 9660 (MISCELLANEOUS) IMPLANT
CLIP APPLIE 11 MED OPEN (CLIP) ×2 IMPLANT
CLOSURE STERI STRIP 1/2 X4 (GAUZE/BANDAGES/DRESSINGS) ×4 IMPLANT
CLOTH BEACON ORANGE TIMEOUT ST (SAFETY) ×4 IMPLANT
CORDS BIPOLAR (ELECTRODE) ×4 IMPLANT
COVER BACK TABLE 24X17X13 BIG (DRAPES) ×2 IMPLANT
COVER MAYO STAND STRL (DRAPES) IMPLANT
COVER SURGICAL LIGHT HANDLE (MISCELLANEOUS) ×2 IMPLANT
COVER TABLE BACK 60X90 (DRAPES) ×2 IMPLANT
DERMABOND ADVANCED (GAUZE/BANDAGES/DRESSINGS) ×2
DERMABOND ADVANCED .7 DNX12 (GAUZE/BANDAGES/DRESSINGS) ×2 IMPLANT
DRAIN CHANNEL 15F RND FF W/TCR (WOUND CARE) IMPLANT
DRAPE C-ARM 42X72 X-RAY (DRAPES) ×4 IMPLANT
DRAPE INCISE IOBAN 66X45 STRL (DRAPES) ×4 IMPLANT
DRAPE LAPAROTOMY T 102X78X121 (DRAPES) ×2 IMPLANT
DRAPE SURG 17X23 STRL (DRAPES) ×4 IMPLANT
DRAPE U-SHAPE 47X51 STRL (DRAPES) ×4 IMPLANT
DRILL BIT QC 3.2 195MM (BIT)
DRSG MEPILEX BORDER 4X8 (GAUZE/BANDAGES/DRESSINGS) ×2 IMPLANT
DURAPREP 26ML APPLICATOR (WOUND CARE) ×4 IMPLANT
ELECT BLADE 4.0 EZ CLEAN MEGAD (MISCELLANEOUS) ×2
ELECT CAUTERY BLADE 6.4 (BLADE) ×2 IMPLANT
ELECT REM PT RETURN 9FT ADLT (ELECTROSURGICAL) ×4
ELECTRODE BLDE 4.0 EZ CLN MEGD (MISCELLANEOUS) ×1 IMPLANT
ELECTRODE REM PT RTRN 9FT ADLT (ELECTROSURGICAL) ×2 IMPLANT
EVACUATOR 1/8 PVC DRAIN (DRAIN) IMPLANT
GAUZE SPONGE 4X4 16PLY XRAY LF (GAUZE/BANDAGES/DRESSINGS) ×2 IMPLANT
GLOVE BIOGEL PI IND STRL 6.5 (GLOVE) ×1 IMPLANT
GLOVE BIOGEL PI IND STRL 7.5 (GLOVE) ×1 IMPLANT
GLOVE BIOGEL PI IND STRL 8.5 (GLOVE) ×1 IMPLANT
GLOVE BIOGEL PI INDICATOR 6.5 (GLOVE) ×1
GLOVE BIOGEL PI INDICATOR 7.5 (GLOVE) ×1
GLOVE BIOGEL PI INDICATOR 8.5 (GLOVE) ×1
GLOVE ECLIPSE 6.0 STRL STRAW (GLOVE) ×2 IMPLANT
GLOVE ECLIPSE 7.5 STRL STRAW (GLOVE) ×2 IMPLANT
GLOVE ECLIPSE 8.5 STRL (GLOVE) ×2 IMPLANT
GOWN PREVENTION PLUS XXLARGE (GOWN DISPOSABLE) ×4 IMPLANT
GOWN STRL NON-REIN LRG LVL3 (GOWN DISPOSABLE) ×6 IMPLANT
GRANULE CHRONOS MED 1.4-2.8MM (Orthopedic Implant) ×2 IMPLANT
GUIDEWIRE 1.6X480MM (WIRE) ×12 IMPLANT
HEMOSTAT SNOW SURGICEL 2X4 (HEMOSTASIS) IMPLANT
INSERT FOGARTY 61MM (MISCELLANEOUS) IMPLANT
INSERT FOGARTY SM (MISCELLANEOUS) IMPLANT
KIT BASIN OR (CUSTOM PROCEDURE TRAY) ×2 IMPLANT
KIT POSITION SURG JACKSON T1 (MISCELLANEOUS) ×2 IMPLANT
KIT ROOM TURNOVER OR (KITS) ×4 IMPLANT
LOOP VESSEL MAXI BLUE (MISCELLANEOUS) ×2 IMPLANT
LOOP VESSEL MINI RED (MISCELLANEOUS) ×2 IMPLANT
Locking Caps ×12 IMPLANT
NEEDLE ASP BONE MRW 11GX15 J (NEEDLE) ×2 IMPLANT
NEEDLE I-PASS III (NEEDLE) ×2 IMPLANT
NEEDLE SPNL 18GX3.5 QUINCKE PK (NEEDLE) IMPLANT
NEEDLE SPNL 22GX3.5 QUINCKE BK (NEEDLE) ×2 IMPLANT
NS IRRIG 1000ML POUR BTL (IV SOLUTION) ×2 IMPLANT
PACK LAMINECTOMY ORTHO (CUSTOM PROCEDURE TRAY) ×2 IMPLANT
PACK UNIVERSAL I (CUSTOM PROCEDURE TRAY) ×4 IMPLANT
PAD ARMBOARD 7.5X6 YLW CONV (MISCELLANEOUS) ×8 IMPLANT
PAD SHARPS MAGNETIC DISPOSAL (MISCELLANEOUS) IMPLANT
PATTIES SURGICAL .5 X1 (DISPOSABLE) ×6 IMPLANT
PATTIES SURGICAL .75X.75 (GAUZE/BANDAGES/DRESSINGS) IMPLANT
PENCIL BUTTON HOLSTER BLD 10FT (ELECTRODE) ×2 IMPLANT
PUTTY DBX INJECT 10CC (Putty) ×2 IMPLANT
ROD MIS 75MM (Rod) ×4 IMPLANT
SCREW MATRIX MIS 6.0X35MM (Screw) ×4 IMPLANT
SCREW MATRIX MIS 6.0X40MM (Screw) ×8 IMPLANT
SPONGE INTESTINAL PEANUT (DISPOSABLE) ×14 IMPLANT
SPONGE LAP 18X18 X RAY DECT (DISPOSABLE) ×2 IMPLANT
SPONGE LAP 4X18 X RAY DECT (DISPOSABLE) ×2 IMPLANT
SPONGE SURGIFOAM ABS GEL 100 (HEMOSTASIS) IMPLANT
STAPLER VISISTAT 35W (STAPLE) IMPLANT
STRIP CLOSURE SKIN 1/2X4 (GAUZE/BANDAGES/DRESSINGS) ×8 IMPLANT
SURGIFLO TRUKIT (HEMOSTASIS) IMPLANT
SUT ETHILON 2 0 FS 18 (SUTURE) ×2 IMPLANT
SUT MNCRL AB 3-0 PS2 18 (SUTURE) ×2 IMPLANT
SUT PDS AB 1 CTX 36 (SUTURE) ×2 IMPLANT
SUT PROLENE 4 0 RB 1 (SUTURE) ×4
SUT PROLENE 4-0 RB1 .5 CRCL 36 (SUTURE) ×4 IMPLANT
SUT PROLENE 5 0 CC1 (SUTURE) IMPLANT
SUT PROLENE 6 0 C 1 30 (SUTURE) ×2 IMPLANT
SUT PROLENE 6 0 CC (SUTURE) IMPLANT
SUT SILK 0 TIES 10X30 (SUTURE) ×4 IMPLANT
SUT SILK 2 0 TIES 10X30 (SUTURE) ×4 IMPLANT
SUT SILK 2 0SH CR/8 30 (SUTURE) IMPLANT
SUT SILK 3 0 TIES 10X30 (SUTURE) ×4 IMPLANT
SUT SILK 3 0SH CR/8 30 (SUTURE) IMPLANT
SUT VIC AB 0 CT1 27 (SUTURE) ×2
SUT VIC AB 0 CT1 27XBRD ANBCTR (SUTURE) ×2 IMPLANT
SUT VIC AB 1 CTX 36 (SUTURE) ×2
SUT VIC AB 1 CTX36XBRD ANBCTR (SUTURE) ×2 IMPLANT
SUT VIC AB 2-0 CT1 18 (SUTURE) ×4 IMPLANT
SUT VIC AB 2-0 CTB1 (SUTURE) ×2 IMPLANT
SUT VIC AB 3-0 SH 27 (SUTURE) ×2
SUT VIC AB 3-0 SH 27X BRD (SUTURE) ×2 IMPLANT
SUT VIC AB 3-0 SH 27XBRD (SUTURE) IMPLANT
SYR BULB IRRIGATION 50ML (SYRINGE) ×2 IMPLANT
SYR CONTROL 10ML LL (SYRINGE) ×2 IMPLANT
TAP CANN 5MM (TAP) ×2 IMPLANT
TOWEL OR 17X24 6PK STRL BLUE (TOWEL DISPOSABLE) ×4 IMPLANT
TOWEL OR 17X26 10 PK STRL BLUE (TOWEL DISPOSABLE) ×6 IMPLANT
TRAY FOLEY CATH 14FRSI W/METER (CATHETERS) ×2 IMPLANT
WATER STERILE IRR 1000ML POUR (IV SOLUTION) ×2 IMPLANT
YANKAUER SUCT BULB TIP NO VENT (SUCTIONS) ×2 IMPLANT

## 2011-08-13 NOTE — Anesthesia Postprocedure Evaluation (Signed)
Anesthesia Post Note  Patient: [name]  Procedure(s) Performed: Procedure(s) (LRB): ANTERIOR AND POSTERIOR SPINAL FUSION (N/A) ABDOMINAL EXPOSURE (N/A)  Anesthesia type: general  Patient location: PACU  Post pain: Pain level controlled  Post assessment: Patient's Cardiovascular Status Stable  Last Vitals:  Filed Vitals:   08/13/11 1700  BP:   Pulse:   Temp: 36.9 C  Resp:     Post vital signs: Reviewed and stable  Level of consciousness: sedated  Complications: No apparent anesthesia complications

## 2011-08-13 NOTE — Transfer of Care (Signed)
Immediate Anesthesia Transfer of Care Note  Patient: [name]  Procedure(s) Performed: Procedure(s) (LRB): ANTERIOR AND POSTERIOR SPINAL FUSION (N/A) ABDOMINAL EXPOSURE (N/A)  Patient Location: PACU  Anesthesia Type: General  Level of Consciousness: awake, alert , oriented and patient cooperative  Airway & Oxygen Therapy: Patient Spontanous Breathing and Patient connected to face mask oxygen  Post-op Assessment: Report given to PACU RN, Post -op Vital signs reviewed and stable and Patient moving all extremities X 4  Post vital signs: Reviewed and stable  Complications: No apparent anesthesia complications

## 2011-08-13 NOTE — H&P (Signed)
No change in exam Plan on A/P L4-S1 fusion for failed back syndrome

## 2011-08-13 NOTE — Brief Op Note (Signed)
08/13/2011  3:27 PM  PATIENT:  Marjorie Smolder  36 y.o. male  PRE-OPERATIVE DIAGNOSIS:  FAILED BACK SYNDROME   POST-OPERATIVE DIAGNOSIS:  FAILED BACK SYNDROME   PROCEDURE:  Procedure(s) (LRB): ANTERIOR AND POSTERIOR SPINAL FUSION (N/A) ABDOMINAL EXPOSURE (N/A)  SURGEON:  Surgeon(s) and Role: Panel 1:    * Alvy Beal, MD - Primary  Panel 2:    * Chuck Hint, MD - Primary  PHYSICIAN ASSISTANT:   ASSISTANTS: Norval Gable Anterior Approach Surgeon: Dr August Albino   ANESTHESIA:   general  EBL:  Total I/O In: 5800 [I.V.:4700; Blood:100; IV Piggyback:1000] Out: 1235 [Urine:685; Blood:550]  BLOOD ADMINISTERED:100 CC CELLSAVER  DRAINS: none   LOCAL MEDICATIONS USED:  NONE  SPECIMEN:  No Specimen  DISPOSITION OF SPECIMEN:  N/A  COUNTS:  YES  TOURNIQUET:  * No tourniquets in log *  DICTATION: .Other Dictation: Dictation Number [id number]  PLAN OF CARE: Admit to inpatient   PATIENT DISPOSITION:  PACU - hemodynamically stable.

## 2011-08-13 NOTE — H&P (View-Only) (Signed)
[facility] [place]  Patient name: [name] MRN: [id number] DOB: 04/16/1976 Sex: male  REASON FOR CONSULT: evaluate for anterior retroperitoneal exposure of L4-S1. Referred by Dr. Brooks.  HPI: [name] is a 35 y.o. male who injured his back at work on 04/14/2010. He was lifting a heavy spool of wire and developed sudden back pain. He continued to work and his pain worsened. He's had back pain ever since that time. He's had significant degenerative disc disease and has undergone previous discectomy. He's had recurrent symptoms and recurrent problems in his back. He scheduled for stabilization of his back by Dr. Brooks on 08/13/2011 and I was asked to provide anterior retroperitoneal exposure of L4 to S1.  He has tried physical therapy, pain medicine, and injection therapy without relief. Symptoms are aggravated by any activity with really no alleviating factors.  Past Medical History  Diagnosis Date  . GERD (gastroesophageal reflux disease)   . Spine injury 04-14-2010    Workmen's comp injury date    Family History  Problem Relation Age of Onset  . Heart disease Father     Heart Disease before age 60  . Hyperlipidemia Father   . Hypertension Father   . Heart attack Father   . Cancer Sister     SOCIAL HISTORY: History  Substance Use Topics  . Smoking status: Current Everyday Smoker -- 1.0 packs/day  . Smokeless tobacco: Not on file  . Alcohol Use: No    Allergies  Allergen Reactions  . Penicillins     Current Outpatient Prescriptions  Medication Sig Dispense Refill  . Multiple Vitamin (MULITIVITAMIN WITH MINERALS) TABS Take 1 tablet by mouth daily.      . oxyCODONE (OXY IR/ROXICODONE) 5 MG immediate release tablet Take 5 mg by mouth every 6 (six) hours as needed. For pain      . oxyCODONE (OXYCONTIN) 40 MG 12 hr tablet Take 40 mg by mouth every 12 (twelve) hours.      . polyethylene glycol (MIRALAX / GLYCOLAX) packet Take 17 g by mouth every  other day as needed. For constipations        REVIEW OF SYSTEMS: [X ] denotes positive finding; [  ] denotes negative finding  CARDIOVASCULAR:  [ ] chest pain   [ ] chest pressure   [ ] palpitations   [ ] orthopnea   [ ] dyspnea on exertion   [ ] claudication   [ ] rest pain   [ ] DVT   [ ] phlebitis PULMONARY:   [ ] productive cough   [ ] asthma   [ ] wheezing NEUROLOGIC:   [X ] weakness left leg [X ] paresthesias left leg [ ] aphasia  [ ] amaurosis  [ ] dizziness HEMATOLOGIC:   [ ] bleeding problems   [ ] clotting disorders MUSCULOSKELETAL:  [ ] joint pain   [ ] joint swelling [ ] leg swelling GASTROINTESTINAL: [ ]  blood in stool  [ ]  hematemesis GENITOURINARY:  [ ]  dysuria  [ ]  hematuria PSYCHIATRIC:  [ ] history of major depression INTEGUMENTARY:  [ ] rashes  [ ] ulcers CONSTITUTIONAL:  [ ] fever   [ ] chills  PHYSICAL EXAM: Filed Vitals:   08/05/11 1404  BP: 109/64  Pulse: 83  Resp: 16  Height: 5' 11" (1.803 m)  Weight: 155 lb (70.308 kg)  SpO2: 100%   Body mass index is 21.62 kg/(m^2). GENERAL: The patient   is a well-nourished male, in no acute distress. The vital signs are documented above. CARDIOVASCULAR: There is a regular rate and rhythm without significant murmur appreciated. I do not detect carotid bruits. He has palpable femoral pulses and palpable dorsalis pedis and posterior tibial pulses bilaterally. PULMONARY: There is good air exchange bilaterally without wheezing or rales. ABDOMEN: Soft and non-tender with normal pitched bowel sounds.  MUSCULOSKELETAL: There are no major deformities or cyanosis. NEUROLOGIC: No focal weakness or paresthesias are detected. SKIN: There are no ulcers or rashes noted. PSYCHIATRIC: The patient has a normal affect.  DATA: I have reviewed his CT of the lumbar spine. The radiologist's interpretation as noted below. 1. Superior endplate Schmorl's node at L3-4.  2. Right posterolateral annular tear at L3-4 with slight disc  bulging  but no significant stenosis.  3. Diffuse annular degeneration at L4-5.  4. Leak of contrast into the left lateral recess and epidural  space. This may, this may contact of the previous microdiskectomy  given a left-sided laminectomy defect.  5. Right posterolateral disc protrusion and annular tear at L5-S1  with mild right lateral recess and foraminal narrowing.  I have reviewed the films myself. He does has some calcium in both common iliac arteries it is fairly focal.  MEDICAL ISSUES: He appears to be a reasonable candidate for anterior retroperitoneal exposure of L4-S1. I have reviewed our role in exposure of the spine in order to allow anterior lumbar interbody fusion at the appropriate levels. We have discussed the potential complications of surgery, including but not limited to, arterial or venous injury, thrombosis, or bleeding. We have also discussed the potential risks of wound healing problems, the development of a hernia, nerve injury, leg swelling, or other unpredictable medical problems. I've also explained that for the L5-S1 level there is a small risk of retrograde ejaculation. 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 All the patient's questions were answered and they are agreeable to proceed.   Philis Doke S [facility] [place] [facility]: [phone]    

## 2011-08-13 NOTE — Preoperative (Signed)
Beta Blockers   Reason not to administer Beta Blockers:Not Applicable 

## 2011-08-13 NOTE — Progress Notes (Signed)
Pt admitted from PACU following an anterior/posterior fusion. Pt has decreased ROM of abdomen and back related to surgery.  Pt is alert and oriented x 3 but a little groggy. Pt lungs CTA. Heart rate regular rate and rhythm but tachy in the low 100's. No s/sx resp or cardiac distress and no c/o such. Vital signs stable. Pt sats 100% 2lpm. Pt abdomen soft flat and tender at incision site. Mepilex of abdomen is clean, dry and intact. BS+x4. Pt repts LBM 3/5. Pt has a foley draining mod amount of yellow clear urine. Pt has 2 clean, dry and intact mepilex's to back. Pt rept that preop he had low back pain and L leg pain, weakness and numbness and per his admission had to ambulate with a cane. Pt family at bedside and supportive.

## 2011-08-13 NOTE — Interval H&P Note (Signed)
History and Physical Interval Note:  08/13/2011 7:18 AM  [name]  has presented today for surgery, with the diagnosis of FAILED BACK SYNDROME   The various methods of treatment have been discussed with the patient and family. After consideration of risks, benefits and other options for treatment, the patient has consented to: ANTERIOR RETROPERITONEAL EXPOSURE OF LUMBAR FOUR - LUMBAR FIVE AND LUMBAR FIVE AND SACRAL ONE. The patients' history has been reviewed, patient examined, no change in status, stable for surgery.  I have reviewed the patients' chart and labs.  Questions were answered to the patient's satisfaction.     Leontine Radman S

## 2011-08-13 NOTE — Op Note (Signed)
NAME: [name]    MRN: [id number] DOB: 1976-03-30    DATE OF OPERATION: 08/13/2011  PREOP DIAGNOSIS: Degenerative disc disease at L4-L5 and L5-S1  POSTOP DIAGNOSIS: Same  PROCEDURE: Anterior retroperitoneal exposure of L4-L5 and L5-S1  SURGEON: Di Kindle. Edilia Bo, MD, FACS  ASSIST: Jetty Peeks PA  ANESTHESIA: Gen.   EBL: minimal  INDICATIONS: [name] is a 36 y.o. male who presented with a long history of low back pain and was scheduled for ALIF at the L4-L5 and L5-S1 level. I was asked to provide anterior retroperitoneal exposure of these 2 levels.  FINDINGS:  Some calcific plaque in the left common iliac artery. This did not pose a significant problem in terms of the retraction and exposure.  TECHNIQUE: The patient was brought to the operating room and received a general anesthetic. The abdomen was prepped and draped in the usual sterile fashion. I previously marked the level of the 2 discs of interest. A paramedian incision encompassing both levels was made on the left side and dissection carried down to the anterior rectus sheath. The anterior rectus sheath was divided longitudinally beyond the level of the skin incision. The medial border of the rectus abdominis muscle was fully mobilized such that the rectus abdominous muscle could be retracted laterally. The arcuate line was identified and the posterior rectus sheath divided using a 15 blade. The retroperitoneal space was entered and the retroperitoneal plane developed bluntly. This allowed exposure of the psoas muscle. Further retraction medially allowed exposure of the external iliac artery and left common iliac artery. It was some calcific plaque in the common iliac artery. With exception was carried to the level of the sacral promontory and the middle sacral vessels were identified clipped and divided. The anterior longitudinal ligament was scored using electrocautery and the L5-S1 disc exposed enough that a reverse  retractor could be placed on the right side and left-sided disc allowing adequate exposure for ALIF.   Next attention was turned to exposure of the L4-L5 disc. I fully mobilized the external iliac artery and common iliac artery superiorly and inferiorly to allow mobilization of the artery to the patient's right. This allowed exposure of the lateral aspect of the left iliac vein. 2 iliolumbar branches were divided between a 2-0 silk tie and 3 clips. 2 additional lumbar branches were divided between 2-0 silk ties. This allowed full mobilization of the iliac vein to the right and allowed exposure of the L4-L5 disc.  Attention was then turned back to the L5-S1 level. The reverse lid retractors were placed on the right and left side of the disc space allowing adequate exposure. Dr. Shon Baton performed anterior lumbar interbody fusion as dictated separately. Once this was complete the retractors were removed and there was good hemostasis. I then returned to the L4-L5 level level and the iliac vein was retracted to the patient's right. The reverse lip retractors were then placed on the right and left allowing adequate exposure of the L4-L5 disc.  The remainder of the procedure is as dictated by Dr. Shon Baton.  Waverly Ferrari, MD, FACS [facility]  DATE OF DICTATION:   08/13/2011

## 2011-08-13 NOTE — Anesthesia Procedure Notes (Signed)
Procedure Name: Intubation Date/Time: 08/13/2011 7:45 AM Performed by: Leona Singleton A. Patient Re-evaluated:Patient Re-evaluated prior to inductionOxygen Delivery Method: Circle system utilized Preoxygenation: Pre-oxygenation with 100% oxygen Intubation Type: IV induction Ventilation: Mask ventilation without difficulty and Oral airway inserted - appropriate to patient size Laryngoscope Size: Hyacinth Meeker and 2 Grade View: Grade I Tube type: Oral Tube size: 8.0 mm Number of attempts: 1 Airway Equipment and Method: Stylet Placement Confirmation: ETT inserted through vocal cords under direct vision,  positive ETCO2 and breath sounds checked- equal and bilateral Secured at: 21 cm Tube secured with: Tape Dental Injury: Teeth and Oropharynx as per pre-operative assessment

## 2011-08-13 NOTE — Anesthesia Postprocedure Evaluation (Signed)
Anesthesia Post Note  Patient: [name]  Procedure(s) Performed: Procedure(s) (LRB): ANTERIOR AND POSTERIOR SPINAL FUSION (N/A) ABDOMINAL EXPOSURE (N/A)  Anesthesia type: general  Patient location: PACU  Post pain: Pain level controlled  Post assessment: Patient's Cardiovascular Status Stable  Last Vitals:  Filed Vitals:   08/13/11 1545  BP: 121/73  Pulse:   Temp: 37.6 C  Resp:     Post vital signs: Reviewed and stable  Level of consciousness: sedated  Complications: No apparent anesthesia complications

## 2011-08-13 NOTE — Anesthesia Preprocedure Evaluation (Addendum)
Anesthesia Evaluation  Patient identified by MRN, date of birth, ID band Patient awake    Reviewed: Allergy & Precautions, H&P , NPO status , Patient's Chart, lab work & pertinent test results  History of Anesthesia Complications Negative for: history of anesthetic complications  Airway Mallampati: I  Neck ROM: Full    Dental  (+) Teeth Intact, Edentulous Upper, Edentulous Lower and Dental Advisory Given   Pulmonary Current Smoker,  breath sounds clear to auscultation        Cardiovascular Exercise Tolerance: Poor negative cardio ROS  Rhythm:Regular Rate:Normal     Neuro/Psych  Neuromuscular disease (chronic back pain; pain, weakness, numbness LLE) negative psych ROS   GI/Hepatic Neg liver ROS, GERD-  Controlled,  Endo/Other  negative endocrine ROS  Renal/GU negative Renal ROS     Musculoskeletal  (+) Arthritis -, Osteoarthritis,    Abdominal   Peds  Hematology negative hematology ROS (+)   Anesthesia Other Findings   Reproductive/Obstetrics negative OB ROS                          Anesthesia Physical Anesthesia Plan  ASA: I  Anesthesia Plan: General   Post-op Pain Management:    Induction: Intravenous  Airway Management Planned: Oral ETT  Additional Equipment:   Intra-op Plan:   Post-operative Plan: Extubation in OR  Informed Consent: I have reviewed the patients History and Physical, chart, labs and discussed the procedure including the risks, benefits and alternatives for the proposed anesthesia with the patient or authorized representative who has indicated his/her understanding and acceptance.   Dental advisory given  Plan Discussed with: CRNA and Surgeon  Anesthesia Plan Comments:         Anesthesia Quick Evaluation

## 2011-08-14 ENCOUNTER — Inpatient Hospital Stay (HOSPITAL_COMMUNITY): Payer: Worker's Compensation

## 2011-08-14 DIAGNOSIS — R229 Localized swelling, mass and lump, unspecified: Secondary | ICD-10-CM

## 2011-08-14 MED ORDER — FLEET ENEMA 7-19 GM/118ML RE ENEM
1.0000 | ENEMA | Freq: Every day | RECTAL | Status: DC | PRN
  Filled 2011-08-14: qty 1

## 2011-08-14 MED ORDER — POLYETHYLENE GLYCOL 3350 17 G PO PACK: 17.0000 g | PACK | Freq: Every day | ORAL | Status: AC

## 2011-08-14 MED ORDER — MORPHINE SULFATE (PF) 1 MG/ML IV SOLN
INTRAVENOUS | Status: AC
  Filled 2011-08-14: qty 25

## 2011-08-14 MED ORDER — ENOXAPARIN SODIUM 40 MG/0.4ML ~~LOC~~ SOLN: 40.0000 mg | SUBCUTANEOUS | Status: DC

## 2011-08-14 MED ORDER — METHOCARBAMOL 500 MG PO TABS: 500.0000 mg | ORAL_TABLET | Freq: Three times a day (TID) | ORAL | Status: AC

## 2011-08-14 MED ORDER — OXYCODONE HCL 5 MG PO TABS: 10.0000 mg | ORAL_TABLET | ORAL | Status: AC | PRN

## 2011-08-14 MED ORDER — ONDANSETRON HCL 4 MG PO TABS: 4.0000 mg | ORAL_TABLET | Freq: Three times a day (TID) | ORAL | Status: AC | PRN

## 2011-08-14 MED FILL — Hydromorphone HCl Inj 1 MG/ML: INTRAMUSCULAR | Qty: 1 | Status: AC

## 2011-08-14 MED FILL — Acetaminophen IV Soln 10 MG/ML: INTRAVENOUS | Qty: 100 | Status: AC

## 2011-08-14 NOTE — Progress Notes (Signed)
VASCULAR LAB PRELIMINARY  PRELIMINARY  PRELIMINARY  PRELIMINARY  Bilateral lower extremity venous duplex  completed.    Preliminary report:  Bilateral:  No evidence of DVT, superficial thrombosis, or Baker's Cyst.   Terance Hart, RVT 08/14/2011, 3:47 PM

## 2011-08-14 NOTE — Op Note (Signed)
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  OPERATIVE PROCEDURE: 1. Anterior lumbar interbody fusion, L4-5 and L5-S1. 2. Anterior instrumentation, L5-S1. 3. Posterior segmental pedicle screw fixation, L4-5 and L5-S1.  COMPLICATIONS:  None.  CONDITION:  Stable.  INSTRUMENTATION SYSTEM USED:  Titan titanium anterior intervertebral cage, size 14 extra-large at L5-S1 with a single inferior 20-mm screw as a locking device and an L4-5 un-instrumented titanium Titan intervertebral cage 13-mm in height extra large.  SURGEON:  Alvy Beal, MD  ACCESS SURGEON:  Di Kindle. Edilia Bo, MD  FIRST ASSISTANT:  Norval Gable, PA  Posterior instrumentation was the Synthes MIS pedicle screw system with 40 mm screws at L4 and L5 and 35-mm screws at S1.  Intraoperative neuromonitoring for the posterior portion demonstrated no abnormal free running EMGs.  All screws were tested with no evidence of breach.  HISTORY:  [name] is a very pleasant 36 year old gentleman who had two previous lumbar diskectomies for disk herniation with recurrence.  The patient's radicular leg pain had resolved, but he continues to have significant debilitating back pain.  Multiple attempts at conservative management have failed to alleviate his symptoms.  As a result of the ongoing severe pain, the patient elected to proceed with surgery.  All appropriate risks, benefits, and alternatives were discussed with the patient.  Consent was obtained.  OPERATIVE  NOTE:  The patient was brought to the operating room and placed supine on the operating table.  After successful induction of general anesthesia and endotracheal intubation, TEDs, SCDs, and Foley were inserted.  Appropriate time-out was then done to confirm patient, procedure, and affected extremity, and all other pertinent important data.  Once this was done, I assisted Dr. Edilia Bo in prepping and draping the abdomen.  At this point, Dr. Edilia Bo then performed a standard retroperitoneal approach to the anterior spine.  Please refer to his dictation for specifics on the approach.  Once the [place] blades were placed and the L5-S1 disk was exposed, x- rays were done to confirm we are at the appropriate level.  At this point, I scrubbed in and proceeded with the diskectomy.  An annulotomy was performed with a 10-blade scalpel and then using a combination of pituitary rongeurs, curettes, and Kerrison rongeurs, I removed all of the disk material.  I then used a fine nerve hook to develop a plane behind the S1 vertebral body and I swept this nerve hook along the length of the posterior aspect of the vertebral body to release the anulus.  I then removed with a 2 and 3-mm Kerrison the osteophytes and then any remaining portion of the disk and anulus.  At this point, I had removed all the disk material  and released the anulus. I then rasped and to remove all of the cartilaginous endplate.  This exposed this bleeding subchondral bone.  The size 14 extra-large rasp provided excellent fixation and it was firm.  I elected to proceed with this.  I then obtained the appropriate-sized Titan titanium graft, packed it with them.  I then aspirated the S1 vertebral body and then mixed it with chronOS.  I then packed the cage with DBX plus the chronOS.  Once I had this packed, I then malleted to the graft to the appropriate depth.  It was just at the posterior vertebral body and slightly countersunk  anteriorly.  Because I did not get a significant amount of countersink, I then elected to place a single screw to help prevent it from sticking out.  Through the screw hole, I placed a single screw into the vertebral body of S1 with an awl.  This had excellent purchase.  At this point, Dr. Edilia Bo then scrubbed back into the case and repositioned the retractors to now expose the L4-5 level.  Using the same technique that I used at L5-S1, I performed a similar diskectomy. Again, great care was taken to release all the way back to the posterior anulus and released this.  There was actual fragmented disk on the left side that I removed with a pituitary rongeur.  At this point, the nerve hook could freely pass behind the vertebral body of L5 and L4, and I had an adequate diskectomy.  I then again trialed with the rasp and the 13 low extra-large rasp provided the best fit.  I then obtained the cage, packed it appropriately and malleted it down to the appropriate depth. At this time, I was able to actually get a countersunk to a greater degree that I could at L5-S1.  Because of the degree of countersink, I could not easily place, I could not angle my hand safely enough to put the screw in, so I elected to leave no screw at this level.  I removed all of the retractors, hemostasis was obtained and I irrigated the wound copiously with normal saline.  I then closed the rectus fascia with a running #1 PDS and then a layer of 2-0 Vicryls and a 3-0 Monocryl. Steri-Strips and dry dressing were applied.  At this point in time, the neuromonitor represented from [facility] came into the room and placed all the appropriate lower extremity needles for nerve conduction monitoring.  All bony prominences were well padded and the Jackson spine frame was secured over the patient.  The patient was then turned into the prone position for the second portion of the procedure.  Once the patient was position and  all bony prominences were well padded, we again took a second time-out to again confirm that we are doing the L4-5 and L5-S1 fusion and discuss any pertinent data.  Once this was done, the back was now prepped and draped in standard fashion.  I then used x-ray to identify the lateral border of the L4 pedicle and a horizontal/incision was made and the percutaneous guidepin was advanced down to the lateral aspect of the facet complex.  I then malleted the Jamshidi needle through the facet.  I would stimulate the Jamshidi needle and check x-rays to confirm that both radiographically and neurodiagnostically there was no evidence of breach.  Once the Jamshidi was properly positioned to the appropriate depth and confirmed with AP and lateral x-rays, I placed a guidepin through this and clamped  it to the bed.  I repeated this at the L5 and S1 level.  Once I had all 6 pedicles cannulated, 3 on the left and 3 on the right using the same technique, I proceeded with the tapping.  I then tapped over the guidepin and again stimulated the tap to confirm there was no breach and then placed the appropriate-sized screw.  At S1, I placed a 35-mm long 6.0 diameter screw and at L5 and L4, I placed a 40-mm screw, 6.0 diameter.  At each point, all screws were properly fixed, had excellent purchase and there was no neurodiagnostic evidence of pedicle breach.  I repeated the same technique on the contralateral side until all 6 screws were properly positioned.  I then measured and placed a 75-mm long rod and gently passed it subcutaneously.  I then looked down each of the towers to confirm that the rod was within the screw itself.  I then locked the locking nut down and torqued all screws appropriately.  This technique was repeated on the contralateral side until both rods were secured in place.  At this point, I took final x-rays, which demonstrated satisfactory position of the anterior and posterior hardware in  both the AP and lateral planes.  At this point in time, the patient's wounds were irrigated, closed in a layered fashion with 2-0 Vicryl sutures, and then 2-0 PDS simple sutures for the skin itself.  Dry dressings were applied.  The patient was extubated, transferred to the PACU without incident.  At the end of the case, all needle and sponge counts were correct.  There was no adverse intraoperative events.     Alvy Beal, MD     DDB/MEDQ  D:  08/13/2011  T:  08/14/2011  Job:  [id number]

## 2011-08-14 NOTE — Discharge Summary (Signed)
Patient ID: [name] MRN: [id number] DOB/AGE: May 23, 1976 35 y.o.  Admit date: 08/13/2011 Discharge date: 08/15/2011  Admission Diagnoses: laminectomy syndrome with discogenic low back pain    Discharge Diagnoses: laminectomy syndrome with discogenic low back pain s/p Anterior lumbar interbody fusion, L4-5 and L5-S1, Anterior instrumentation, L5-S1, Posterior segmental pedicle screw fixation, L4-5 and L5-S1     Past Medical History  Diagnosis Date  . GERD (gastroesophageal reflux disease)   . Spine injury 04-14-2010    Workmen's comp injury date  . Arthritis     failed back syndrome     Surgeries: Procedure(s): ANTERIOR AND POSTERIOR SPINAL FUSION ABDOMINAL EXPOSURE on 08/13/2011   Consultants: Treatment Team:  Chuck Hint, MD  Discharged Condition: Improved  Hospital Course: [name] is an 36 y.o. male who was admitted 08/13/2011 for operative treatment of laminectomy syndrome. Patient failed conservative treatments (please see the history and physical for the specifics) and had severe unremitting pain that affects sleep, daily activities, and work/hobbies. After pre-op clearance the patient was taken to the operating room on 08/13/2011 and underwent  Procedure(s): ANTERIOR AND POSTERIOR SPINAL FUSION ABDOMINAL EXPOSURE.    Patient was given perioperative antibiotics: Anti-infectives     Start     Dose/Rate Route Frequency Ordered Stop   08/13/11 2000   vancomycin (VANCOCIN) IVPB 1000 mg/200 mL premix        1,000 mg 200 mL/hr over 60 Minutes Intravenous Every 12 hours 08/13/11 1853 08/14/11 1959   08/13/11 1600   vancomycin (VANCOCIN) IVPB 1000 mg/200 mL premix  Status:  Discontinued        1,000 mg 200 mL/hr over 60 Minutes Intravenous Every 12 hours 08/13/11 1552 08/13/11 1853   08/12/11 1530   vancomycin (VANCOCIN) IVPB 1000 mg/200 mL premix        1,000 mg 200 mL/hr over 60 Minutes Intravenous 60 min pre-op 08/12/11 1516 08/13/11 0737            Patient was given sequential compression devices and early ambulation to prevent DVT.   Patient benefited maximally from hospital stay and there were no complications. At the time of discharge, the patient was moving their bowels without difficulty, tolerating a regular diet, pain is controlled with oral pain medications and they have been cleared by PT/OT.   Recent vital signs: Patient Vitals for the past 24 hrs:  BP Temp Temp src Pulse Resp SpO2  08/14/11 0801 114/60 mmHg 97.4 F (36.3 C) - 112  19  100 %  08/14/11 0457 - - - - 17  100 %  08/14/11 0400 - - - - 19  100 %  08/14/11 0040 - - - - 15  100 %  08/13/11 2200 142/68 mmHg 98.4 F (36.9 C) Oral 101  19  97 %  08/13/11 2100 113/70 mmHg 99 F (37.2 C) Oral 107  - -  08/13/11 2000 - - - - 16  99 %  08/13/11 1733 - - - - 16  98 %  08/13/11 1715 156/77 mmHg 99.4 F (37.4 C) Oral 101  16  100 %  08/13/11 1700 - 98.4 F (36.9 C) - - - -  08/13/11 1545 121/73 mmHg 99.6 F (37.6 C) - - - -     Recent laboratory studies: No results found for this basename: WBC:2,HGB:2,HCT:2,PLT:2,NA:2,K:2,CL:2,CO2:2,BUN:2,CREATININE:2,GLUCOSE:2,PT:2,INR:2,CALCIUM,2: in the last 72 hours   Discharge Medications:   Medication List  As of 08/14/2011  8:17 AM   TAKE these medications  enoxaparin 40 MG/0.4ML Soln   Commonly known as: LOVENOX   Inject 0.4 mLs (40 mg total) into the skin daily. For 10 (ten) days      methocarbamol 500 MG tablet   Commonly known as: ROBAXIN   Take 1 tablet (500 mg total) by mouth 3 (three) times daily. MAX 3 pills daily      mulitivitamin with minerals Tabs   Take 1 tablet by mouth daily.      ondansetron 4 MG tablet   Commonly known as: ZOFRAN   Take 1 tablet (4 mg total) by mouth every 8 (eight) hours as needed for nausea. MAX 3 pills daily      oxyCODONE 40 MG 12 hr tablet   Commonly known as: OXYCONTIN   Take 40 mg by mouth every 12 (twelve) hours.      oxyCODONE 5 MG immediate release tablet    Commonly known as: Oxy IR/ROXICODONE   Take 2 tablets (10 mg total) by mouth every 4 (four) hours as needed for pain.      polyethylene glycol packet   Commonly known as: MIRALAX / GLYCOLAX   Take 17 g by mouth daily. Take 1 packet daily until bowels become regular            Diagnostic Studies:  Chest 2 View  08/10/2011  *RADIOLOGY REPORT*  Clinical Data: Smoking history.  Preoperative study for lumbar fusion.  CHEST - 2 VIEW  Comparison: None.  Findings: Heart size is normal.  Mediastinal shadows are normal. The lungs are clear.  No infiltrate, mass, effusion or collapse.  IMPRESSION: Normal chest.  Original Report Authenticated By: Thomasenia Sales, M.D.   Dg Lumbar Spine 2-3 Views  08/13/2011  *RADIOLOGY REPORT*  Clinical Data: 36 year old male status post lumbar surgery.  LUMBAR SPINE - 2-3 VIEW  Comparison: 08/10/2011.  Fluoroscopy time of 3.2 minutes was utilized.  Findings: Three intraoperative fluoroscopic views of the lumbar spine.  Transpedicular hardware placed at L4, L5, and S1. Interbody implant placed at L4-L5 and L5-S1.  Hardware appears intact.  IMPRESSION: L4-L5 and L5-S1 posterior and interbody fusion.  Original Report Authenticated By: Harley Hallmark, M.D.   Dg Lumbar Spine 2-3 Views  08/10/2011  *RADIOLOGY REPORT*  Clinical Data: For fusion.  LUMBAR SPINE - 2-3 VIEW  Comparison: Post discogram CT 07/03/2011.  Findings: Five non-rib-bearing lumbar-type vertebra with the last fully open disc space labeled L5-S1.  Schmorl's node deformity most notable L3-4 and L4-5.  L4-5 disc space narrowing.  The iliac crest crosses at the lower L4 level.  Radiopaque structure overlying the sacrum on the frontal view appears to be outside the patient.  IMPRESSION: Last fully open disc space labeled L5-S1.  Please see above.  Original Report Authenticated By: Fuller Canada, M.D.   Dg Or Local Abdomen  08/13/2011  *RADIOLOGY REPORT*  Clinical Data: L4-L5 and L5-S1 anterior posterior fusion.  PORTABLE ABDOMEN Comparison: Lumbar radiographs dated 08/10/2011. Findings: A single portable view of the abdomen demonstrates the lower lumbar spine.  Compared to the prior study, there has been interval placement of what appear to be disc arthroplasties at the L4-L5 and L5-S1.  Multiple surgical clips are noted projecting over the left hemi-sacrum.  Visualized bowel gas pattern is unremarkable. No unexpected radiopaque instruments or other foreign bodies are identified. IMPRESSION: Intraoperative study demonstrating what appear to be disc arthroplasties at L4-L5 and L5-S1, as above.  Original Report Authenticated By: Florencia Reasons, M.D.    Discharge Orders  Future Orders Please Complete By Expires   Face-to-face encounter (required for Medicare/Medicaid patients)      Comments:   I Gwinda Maine certify that this patient is under my care and that I, or a nurse practitioner or physician's assistant working with me, had a face-to-face encounter that meets the physician face-to-face encounter requirements with this patient on 08/14/2011.       Questions: Responses:   The encounter with the patient was in whole, or in part, for the following medical condition, which is the primary reason for home health care failed back symdrome   I certify that, based on my findings, the following services are medically necessary home health services Physical therapy   My clinical findings support the need for the above services OTHER SEE COMMENTS   Further, I certify that my clinical findings support that this patient is homebound (i.e. absences from home require considerable and taxing effort and are for medical reasons or religious services or infrequently or of short duration when for other reasons) Ambulates short distances less than 300 feet   To provide the following care/treatments PT    OT      Follow-up Information    Follow up with Alvy Beal, MD in 2 weeks.   Contact information:    [facility] [address] [place] [address] [phone]          Discharge Plan:  discharge to Home with home health services  Disposition: stable at discharge    Signed: Gwinda Maine 08/14/2011, 8:17 AM

## 2011-08-14 NOTE — Progress Notes (Signed)
VASCULAR PROGRESS NOTE  SUBJECTIVE: Complains of mild nausea. Pain adequately controlled.  PHYSICAL EXAM: Filed Vitals:   08/13/11 2200 08/14/11 0040 08/14/11 0400 08/14/11 0457  BP: 142/68     Pulse: 101     Temp: 98.4 F (36.9 C)     TempSrc: Oral     Resp: 19 15 19 17   SpO2: 97% 100% 100% 100%   Abdomen: Normal pitched bowel sounds. Palpable left dorsalis pedis pulse.  ASSESSMENT/PLAN: 1. 1 Day Post-Op s/p: anterior retroperitoneal exposure of L4-L5 and L5-S1 2. Doing well postop. Will be available as needed.  Waverly Ferrari, MD, FACS Beeper: [phone] 08/14/2011

## 2011-08-14 NOTE — Progress Notes (Signed)
CARE MANAGEMENT NOTE 08/14/2011  Discharge planning.      Status of service:  In process, will continue to follow  Comments:  08/14/11 1600 Vance Peper, RN BSN Case Manager Spoke with RN for [employer] [phone]. States patient should have all his DME- has a rolling walker, shower chair from previous surgery. Should he need Home Health therapy we are to contact [phone]. Will follow

## 2011-08-14 NOTE — Progress Notes (Signed)
PT EVALUATION  08/14/11 1417  PT Visit Information  Last PT Received On 08/14/11  Patient Stated Goals  Goal #1 reduced pain  Precautions  Precautions Back  Precaution Comments pt. able to state and demo 3/3 back precautions and log roll  Required Braces or Orthoses No (no brace orders in chart; pt. has neoprene back support )  Restrictions  Weight Bearing Restrictions No  Home Living  Lives With Significant other  Type of Home House  Home Layout Two level;Full bath on main level;Able to live on main level with bedroom/bathroom  Home Access Stairs to enter  Entrance Stairs-Rails None  Entrance Stairs-Number of Steps 3  Bathroom Shower/Tub Tub/shower unit;Curtain  Data processing manager Bedside commode/3-in-1;Walker - rolling;Shower chair with back;Straight cane  Prior Function  Level of Independence Independent with basic ADLs;Independent with gait;Independent with transfers;Requires assistive device for independence  Able to Take Stairs? Yes  Driving No  Vocation Workers Wellsite geologist  Overall Cognitive Status Appears within functional limits for tasks assessed  Orientation Level Oriented X4  Sensation  Light Touch Appears Intact (lower legs)  Bed Mobility  Bed Mobility Yes  Rolling Right 5: Supervision  Right Sidelying to Sit 5: Supervision;With rails;HOB elevated (comment degrees)  Sit to Sidelying Right 4: Min assist;HOB elevated (comment degrees) (assist to bring LEs up to bed)  Sit to Sidelying Right Details (indicate cue type and reason) vc's for back precautions  Transfers  Transfers Yes  Sit to Stand 4: Min assist;From bed;With upper extremity assist  Sit to Stand Details (indicate cue type and reason) vc's for erect spine  Stand to Sit 5: Supervision;To bed;With upper extremity assist  Ambulation/Gait  Ambulation/Gait Yes  Ambulation/Gait Assistance 4: Min assist  Ambulation/Gait Assistance Details  (indicate cue type and reason) slow pace, antalgic  Ambulation Distance (Feet) 50 Feet  Assistive device Rolling walker  Gait Pattern Step-to pattern;Ataxic  Stairs No  RLE Assessment  RLE Assessment WFL  LLE Assessment  LLE Assessment WFL  PT - End of Session  Equipment Utilized During Treatment Gait belt  Activity Tolerance Patient limited by pain  Patient left in bed;with call bell in reach;with family/visitor present  Nurse Communication Mobility status for transfers;Mobility status for ambulation  General  Behavior During Session [facility] for tasks performed  Cognition [facility] for tasks performed  PT Assessment  Clinical Impression Statement Pt. is s/p A/P lumbar spinal fusion with abdominal exposure.  Pt. has decreased functional mobility and gait.  Needs acute PT to address mobility issues.  Pt. with significant abdominal pain during session.  PT Recommendation/Assessment Patient will need skilled PT in the acute care venue  PT Problem List Decreased activity tolerance;Decreased mobility;Decreased knowledge of use of DME;Pain  Barriers to Discharge None  PT Therapy Diagnosis  Difficulty walking;Acute pain  PT Plan  PT Frequency Min 6X/week  PT Treatment/Interventions DME instruction;Gait training;Stair training;Functional mobility training;Therapeutic activities;Patient/family education  PT Recommendation  Recommendations for Other Services OT consult  Follow Up Recommendations No PT follow up  Equipment Recommended None recommended by PT  Individuals Consulted  Consulted and Agree with Results and Recommendations Patient  Acute Rehab PT Goals  PT Goal Formulation With patient  Time For Goal Achievement 7 days  Pt will go Supine/Side to Sit with modified independence  PT Goal: Supine/Side to Sit - Progress Goal set today  Pt will go Sit to Supine/Side with modified independence  PT Goal: Sit to Supine/Side - Progress Goal  set today  Pt will go Sit to Stand with modified  independence  PT Goal: Sit to Stand - Progress Goal set today  Pt will go Stand to Sit with modified independence  PT Goal: Stand to Sit - Progress Goal set today  Pt will Ambulate 51 - 150 feet;with modified independence;with least restrictive assistive device  PT Goal: Ambulate - Progress Goal set today  Pt will Go Up / Down Stairs 3-5 stairs;with least restrictive assistive device  PT Goal: Up/Down Stairs - Progress Goal set today  Additional Goals  Additional Goal #1 pt. will consistently demo 3/3 back precautions and log roll  PT Goal: Additional Goal #1 - Progress Goal set today   Weldon Picking PT Acute Rehab Services [phone] Beeper [phone]

## 2011-08-14 NOTE — Progress Notes (Addendum)
Subjective: Procedure(s) (LRB): ANTERIOR AND POSTERIOR SPINAL FUSION (N/A) ABDOMINAL EXPOSURE (N/A) 1 Day Post-Op  Patient reports pain as moderate.  Reports no leg pain reports incisional back pain   Negative bowel movement Negative flatus Negative chest pain or shortness of breath  Objective: Vital signs in last 24 hours: Temp:  [97.4 F (36.3 C)-99.6 F (37.6 C)] 97.4 F (36.3 C) (03/08 0801) Pulse Rate:  [101-112] 112  (03/08 0801) Resp:  [15-19] 19  (03/08 0801) BP: (113-156)/(60-77) 114/60 mmHg (03/08 0801) SpO2:  [97 %-100 %] 100 % (03/08 0801)  Intake/Output from previous day: 03/07 0701 - 03/08 0700 In: 6620 [P.O.:120; I.V.:5000; Blood:100; IV Piggyback:1400] Out: 3785 [Urine:3235; Blood:550]  No results found for this basename: WBC:2,RBC:2,HCT:2,PLT:2 in the last 72 hours No results found for this basename: NA:2,K:2,CL:2,CO2:2,BUN:2,CREATININE:2,GLUCOSE:2,CALCIUM:2 in the last 72 hours No results found for this basename: LABPT:2,INR:2 in the last 72 hours  ABD soft Neurovascular intact Sensation intact distally Intact pulses distally Dorsiflexion/Plantar flexion intact Incision: dressing C/D/I Compartment soft  Assessment/Plan: Patient stable  Remove foley Patient denied going for xrays this morning do to pain.  Will go for films later in the day Continue mobilization with physical therapy Advance diet when tolerated Up with therapy Continue care; discharge likey Sunday  Gwinda Maine 08/14/2011, 8:04 AM   Patient with post-op ileus Abdominal and back pain only NO radicular leg pain.  Plan: NPO Fleets enema NGT Await LE doppler Hold on lumbar xrays for now Monitor exam

## 2011-08-15 ENCOUNTER — Inpatient Hospital Stay (HOSPITAL_COMMUNITY): Payer: Worker's Compensation

## 2011-08-15 MED ORDER — BIOTENE DRY MOUTH MT LIQD: 15.0000 mL | Freq: Two times a day (BID) | OROMUCOSAL | Status: DC

## 2011-08-15 MED ORDER — ALUM & MAG HYDROXIDE-SIMETH 200-200-20 MG/5ML PO SUSP: 15.0000 mL | ORAL | Status: AC | PRN

## 2011-08-15 MED ORDER — ALUM & MAG HYDROXIDE-SIMETH 200-200-20 MG/5ML PO SUSP
15.0000 mL | ORAL | Status: DC | PRN
  Administered 2011-08-15: 15 mL via ORAL

## 2011-08-15 MED ORDER — BISACODYL 10 MG RE SUPP
10.0000 mg | Freq: Once | RECTAL | Status: AC
  Administered 2011-08-15: 10 mg via RECTAL
  Filled 2011-08-15: qty 1

## 2011-08-15 MED ORDER — CHLORHEXIDINE GLUCONATE 0.12 % MT SOLN
15.0000 mL | Freq: Two times a day (BID) | OROMUCOSAL | Status: DC
  Administered 2011-08-15: 15 mL via OROMUCOSAL
  Filled 2011-08-15 (×3): qty 15

## 2011-08-15 NOTE — Plan of Care (Signed)
Problem: Phase II Progression Outcomes Goal: PT/OT consults completed Outcome: Completed/Met Date Met:  08/15/11 OT eval completed 08/15/2011, PT eval completed 08/14/2011 Ignacia Palma, OTR/L [phone] 08/15/2011

## 2011-08-15 NOTE — Progress Notes (Signed)
Subjective: 2 Days Post-Op Procedure(s) (LRB): ANTERIOR AND POSTERIOR SPINAL FUSION (N/A) ABDOMINAL EXPOSURE (N/A) Patient reports pain as moderate.  C/o gas pain.  + BM.    Objective: Vital signs in last 24 hours: Temp:  [99.5 F (37.5 C)-99.7 F (37.6 C)] 99.5 F (37.5 C) (03/09 0542) Pulse Rate:  [116-132] 123  (03/09 0542) Resp:  [20] 20  (03/09 0542) BP: (116-148)/(68-88) 116/68 mmHg (03/09 0542) SpO2:  [95 %-100 %] 97 % (03/09 0542)  Intake/Output from previous day: 03/08 0701 - 03/09 0700 In: 900 [I.V.:900] Out: 875 [Urine:875] Intake/Output this shift:    No results found for this basename: HGB:5 in the last 72 hours No results found for this basename: WBC:2,RBC:2,HCT:2,PLT:2 in the last 72 hours No results found for this basename: NA:2,K:2,CL:2,CO2:2,BUN:2,CREATININE:2,GLUCOSE:2,CALCIUM:2 in the last 72 hours No results found for this basename: LABPT:2,INR:2 in the last 72 hours  wounds c/d/i.  dressings changed.  Assessment/Plan: 2 Days Post-Op Procedure(s) (LRB): ANTERIOR AND POSTERIOR SPINAL FUSION (N/A) ABDOMINAL EXPOSURE (N/A) Discharge home with home health  Achsah Mcquade, [facility] 08/15/2011, 10:50 AM

## 2011-08-15 NOTE — Plan of Care (Signed)
Problem: Phase III Progression Outcomes Goal: Demonstrates proper use of assistive devices Outcome: Completed/Met Date Met:  08/15/11 RW and tub seat with back. Ignacia Palma, [place] [phone] 08/15/2011

## 2011-08-15 NOTE — Progress Notes (Signed)
Patient complain of abdominale bloating and pain. Patient refused miralax d/t patient stated it doesn't work. Patient refused NG tube d/t unable to tolerate it. Spoke with [employer] PA , will give suppository per order. Steele Berg RN

## 2011-08-15 NOTE — Evaluation (Signed)
Occupational Therapy Evaluation and Discharge Note Patient Details Name: [name] MRN: [id number] DOB: 12/23/1975 Today's Date: 08/15/2011  Problem List:  Patient Active Problem List  Diagnoses  . Spinal stenosis, lumbar region, without neurogenic claudication    Past Medical History:  Past Medical History  Diagnosis Date  . GERD (gastroesophageal reflux disease)   . Spine injury 04-14-2010    Workmen's comp injury date  . Arthritis     failed back syndrome    Past Surgical History:  Past Surgical History  Procedure Date  . Spine surgery   . Back surgery     08/2010 & 11/06/2010    OT Assessment/Plan/Recommendation OT Assessment Clinical Impression Statement: This 36 yo s/p back fusion presents to acute OT with all education completed, no further OT needs, will sign off. OT Recommendation/Assessment: Patient does not need any further OT services OT Recommendation Follow Up Recommendations: No OT follow up Equipment Recommended: None recommended by OT OT Goals    OT Evaluation Precautions/Restrictions  Precautions Precautions: Back Precaution Comments: pt. needed several reminders on no twisting with mobility Required Braces or Orthoses: No Restrictions Weight Bearing Restrictions: No Prior Functioning Home Living Lives With: Significant other;Other (Comment) (fiancee) Type of Home: House Home Layout: Two level;Full bath on main level;Able to live on main level with bedroom/bathroom Home Access: Stairs to enter Entrance Stairs-Rails: None Entrance Stairs-Number of Steps: 3 Bathroom Shower/Tub: Forensic scientist: Standard Bathroom Accessibility: Yes How Accessible: Accessible via walker Home Adaptive Equipment: Bedside commode/3-in-1;Grab bars in shower;Walker - rolling;Straight cane;Reacher;Long-handled sponge;Long-handled shoehorn;Sock aid Prior Function Level of Independence: Independent with basic ADLs;Independent with homemaking with  ambulation;Independent with gait;Independent with transfers Driving: No Vocation: Workers comp ADL ADL Ambulation Related to ADLs: Supervision ADL Comments: Pt able to complete tub transfer with Supervison with only cues for technique of side stepping and backing up to sit down on seat then reversing the process to get out of the tub. No brace at this time. Pt can state 3/3 back precautions, but needs cues to not forget not to twist. Has A prn at home for B/D and has all needed DME/AE. Vision/Perception    Cognition Cognition Arousal/Alertness: Awake/alert Overall Cognitive Status: Appears within functional limits for tasks assessed Orientation Level: Oriented X4 Sensation/Coordination   Extremity Assessment RUE Assessment RUE Assessment: Within Functional Limits LUE Assessment LUE Assessment: Within Functional Limits Mobility  Bed Mobility Bed Mobility: No Transfers Transfers: Yes Sit to Stand: 5: Supervision;With upper extremity assist;With armrests;From chair/3-in-1 Stand to Sit: 5: Supervision;With upper extremity assist;With armrests;To chair/3-in-1 Stand to Sit Details: vc's to keep spine straight and bend knees Exercises   End of Session OT - End of Session Equipment Utilized During Treatment: Other (comment) (RW, tub seat with back) Activity Tolerance: Patient limited by pain Patient left: in chair;with call bell in reach;Other (comment) (phone in reach) General Behavior During Session: [facility] for tasks performed Cognition: [facility] for tasks performed   Evette Georges [phone] 08/15/2011, 11:31 AM

## 2011-08-15 NOTE — Progress Notes (Signed)
PT TREATMENT NOTE  08/15/11 0946  PT Visit Information  Last PT Received On 08/15/11  Precautions  Precautions Back  Precaution Comments pt. needed several reminders on no twisting with mobility  Required Braces or Orthoses No  Restrictions  Weight Bearing Restrictions No  Bed Mobility  Bed Mobility No  Transfers  Transfers Yes  Sit to Stand Not tested (comment);Other (comment) (pt. standing up in room upon PT arrival)  Stand to Sit 5: Supervision;To chair/3-in-1;With upper extremity assist;With armrests  Stand to Sit Details vc's to keep spine straight and bend knees  Ambulation/Gait  Ambulation/Gait Yes  Ambulation/Gait Assistance 5: Supervision  Ambulation/Gait Assistance Details (indicate cue type and reason) cues to prevent twisting while turning with RW  Ambulation Distance (Feet) 200 Feet  Assistive device Rolling walker  Gait Pattern Step-to pattern  Gait velocity slow  Stairs Yes  Stairs Assistance 4: Min assist  Stairs Assistance Details (indicate cue type and reason) and hold assist on right side due to no railings at home  Stair Management Technique No rails;Step to pattern;Forwards  Number of Stairs 3   PT - End of Session  Equipment Utilized During Treatment Gait belt  Activity Tolerance Patient tolerated treatment well (some abdominal pain but much improved from yesterday)  Patient left in chair;with call bell in reach;Other (comment) (OT present)  Nurse Communication Mobility status for transfers;Mobility status for ambulation  General  Behavior During Session [facility] for tasks performed  Cognition [facility] for tasks performed  PT - Assessment/Plan  Comments on Treatment Session Pt.'s pain controlled much better today, not as bloated/not as much pressure in abdomen by his report.  Mobilizing well with RW.  Up in room ad lib.  Needed several reminders to avoid twisting with mobility.  Able to negotiate steps with one assist.  Will be ready for DC from PT standpoint  when medically ready.  PT Frequency Min 6X/week  Follow Up Recommendations No PT follow up  Equipment Recommended None recommended by PT  Acute Rehab PT Goals  PT Goal: Stand to Sit - Progress Progressing toward goal  PT Goal: Ambulate - Progress Progressing toward goal  Pt will Go Up / Down Stairs 3-5 stairs;with least restrictive assistive device;with min assist  PT Goal: Up/Down Stairs - Progress Met  Additional Goals  PT Goal: Additional Goal #1 - Progress Progressing toward goal   Weldon Picking PT Acute Rehab Services [phone] Beeper [phone]

## 2011-08-17 MED FILL — Heparin Sodium (Porcine) Inj 1000 Unit/ML: INTRAMUSCULAR | Qty: 30 | Status: AC

## 2011-08-17 MED FILL — Sodium Chloride IV Soln 0.9%: INTRAVENOUS | Qty: 1000 | Status: AC

## 2011-08-17 MED FILL — Sodium Chloride IV Soln 0.9%: INTRAVENOUS | Qty: 1000 | Status: CN

## 2011-08-17 MED FILL — Heparin Sodium (Porcine) Inj 1000 Unit/ML: INTRAMUSCULAR | Qty: 30 | Status: CN

## 2011-08-17 MED FILL — Sodium Chloride Irrigation Soln 0.9%: Qty: 3000 | Status: CN

## 2011-08-17 MED FILL — Sodium Chloride Irrigation Soln 0.9%: Qty: 3000 | Status: AC

## 2011-08-26 ENCOUNTER — Encounter (HOSPITAL_COMMUNITY): Payer: Self-pay | Admitting: Orthopedic Surgery

## 2012-09-19 ENCOUNTER — Encounter (HOSPITAL_COMMUNITY): Payer: Self-pay | Admitting: Pharmacy Technician

## 2012-09-19 NOTE — Pre-Procedure Instructions (Signed)
[name]  09/19/2012   Your procedure is scheduled on:  Wednesday, April 16th   Report to [facility] Short Stay Center at 6:30 AM.             (Take Caffie Damme Elevators to 3rd floor)  Call this number if you have problems the morning of surgery: [phone]   Remember:   Do not eat food or drink liquids after midnight Tuesday.   Take these medicines the morning of surgery with A SIP OF WATER: Imdur, Metoprolol, Paxil   Do not wear jewelry.  Do not wear lotions, powders, or colognes. You may NOT wear deodorant.   Men may shave face and neck.   Do not bring valuables to the hospital.  Contacts, dentures or bridgework may not be worn into surgery.   Leave suitcase in the car. After surgery it may be brought to your room.  For patients admitted to the hospital, checkout time is 11:00 AM the day of discharge.   Name and phone number of your driver:    Special Instructions: Shower using CHG 2 nights before surgery and the night before surgery.  If you shower the day of surgery use CHG.  Use special wash - you have one bottle of CHG for all showers.  You should use approximately 1/3 of the bottle for each shower.   Please read over the following fact sheets that you were given: Pain Booklet, Coughing and Deep Breathing, MRSA Information and Surgical Site Infection Prevention

## 2012-09-19 NOTE — Progress Notes (Signed)
1655   Called [facility] and asked them to put orders in for this patient...Marland KitchenDA

## 2012-09-20 ENCOUNTER — Encounter (HOSPITAL_COMMUNITY)
Admission: RE | Admit: 2012-09-20 | Discharge: 2012-09-20 | Disposition: A | Discharge status: Home / Self Care | Payer: Worker's Compensation | Source: Ambulatory Visit | Attending: Orthopedic Surgery | Admitting: Orthopedic Surgery

## 2012-09-20 ENCOUNTER — Encounter (HOSPITAL_COMMUNITY): Payer: Self-pay

## 2012-09-20 DIAGNOSIS — G894 Chronic pain syndrome: Secondary | ICD-10-CM | POA: Diagnosis present

## 2012-09-20 HISTORY — DX: Angina pectoris, unspecified: I20.9

## 2012-09-20 HISTORY — DX: Essential (primary) hypertension: I10

## 2012-09-20 LAB — CBC
HCT: 38.5 % — ABNORMAL LOW (ref 39.0–52.0)
Hemoglobin: 14 g/dL (ref 13.0–17.0)
MCH: 30.6 pg (ref 26.0–34.0)
MCV: 84.1 fL (ref 78.0–100.0)
RBC: 4.58 MIL/uL (ref 4.22–5.81)

## 2012-09-20 LAB — BASIC METABOLIC PANEL
BUN: 10 mg/dL (ref 6–23)
CO2: 31 mEq/L (ref 19–32)
Calcium: 9.8 mg/dL (ref 8.4–10.5)
Chloride: 100 mEq/L (ref 96–112)
Creatinine, Ser: 0.78 mg/dL (ref 0.50–1.35)
Glucose, Bld: 99 mg/dL (ref 70–99)

## 2012-09-20 NOTE — H&P (Signed)
  History of Present Illness The patient is a 37 year old male who presents today for follow up of their back. The patient is being followed for their central back pain. The following medication has been used for pain control: Oxycodone. The patient reports their current pain level to be 10 / 10. The patient presents today following trial SCS.  Cinsere returns today for a followup. He has had the spinal cord stimulator trial with Dr. Freida Busman- Eduard Clos using [employer] and had an excellent ten day trial. Since the implant has been removed his pain has gotten severe. At this point in time he is presenting for a discussion about permanent implantation.  Allergies PENCILLIN   Social History Tobacco use. Current every day smoker. 1 ppd current every day smoker; smoke(d) 3 or more pack(s) per day 109604 1/2 pack daily   Medication History Oxycodone HCl (10MG  Tablet ER, Oral) Active. Compound Cream Active.   Vitals 08/16/2012 2:52 PM Weight: 151 lb Height: 71 in Body Surface Area: 1.85 m Body Mass Index: 21.06 kg/m    Objective Transcription  He is a pleasant gentleman who appears younger than his stated age. He is alert and oriented times three. No shortness of breath or chest pain. Abdomen is soft and nontender. His lung fields are clear to auscultation. He has no focal neurological deficits in the lower extremity. He does have significant pain in the back, loss of motion of his back because of the pain. He does walk with an altered gait pattern. His EHL,tibialis anterior, gastrocnemius are intact. Sensation is diffusely diminished in his lower extremity. Compartments are soft and nontender. He has intact peripheral pulses bilaterally. No hip, knee or ankle pain with joint range of motion.  MRI Thoracic spine: no significant spinal stenosis.  Minimal disc bulges - no contra-indication to implantation of SCS Pain, Lumbar (LBP) (724.2) Chronic pain syndrome (338.4) At  this point in time we have reviewed the procedure for implantation. This would be a thoracic incision to place the lead, submuscular passing of the wires and a second incision below the belt line on the left side to place the battery. The surgery should take about 2 1/2 hours. He will remain overnight and then go home the next day. I will continue to defer his pain medication requirements to Dr. Nickola Major. I did review the risk with him to include infection, bleeding, nerve damage, death , stroke, paralysis,ongoing or worse pain, hardware failure, need for revision surgery, migration of the lead, leak of spinal fluid. As is my protocol, I will get a STAT MRI of his thoracic spine preoperatively to ensure there is adequate space in the thoracic canal to accommodate the lead. Once I review the thoracic spine as long as it is adequate I will contact him and let him know and we will proceed with the surgery. Both he and his wife are present for the dictation and they expressed an understanding of the risks, benefits, procedure itself and all of their questions were encouraged and answered.

## 2012-09-20 NOTE — Progress Notes (Signed)
Patient stated he has had chest pressure  In  [facility]   AND NEEDS TO SEE CARDIOLOGIST  AT [facility] ,  BUT HAS NOT YET. DR. Noreene Larsson SAW PATIENT.

## 2012-09-20 NOTE — Pre-Procedure Instructions (Signed)
[name]  09/20/2012   Your procedure is scheduled on:  Thursday  09/22/12   Report to [facility] Stay [facility] at CALL [phone]  AT 800 AM FOR ARRIVAL TIME  .  Call this number if you have problems the morning of surgery: [phone]   Remember:   Do not eat food or drink liquids after midnight.   Take these medicines the morning of surgery with A SIP OF WATER:  ISOSORBIDE MONONITRATE(IMDUR), LOPRESSOR(METOPROLOL), PAXIL, OXYCODONE IF NEEDED   Do not wear jewelry, make-up or nail polish.  Do not wear lotions, powders, or perfumes. You may wear deodorant.  Do not shave 48 hours prior to surgery. Men may shave face and neck.  Do not bring valuables to the hospital.  Contacts, dentures or bridgework may not be worn into surgery.  Leave suitcase in the car. After surgery it may be brought to your room.  For patients admitted to the hospital, checkout time is 11:00 AM the day of  discharge.   Patients discharged the day of surgery will not be allowed to drive  home.  Name and phone number of your driver:   Special Instructions: Shower using CHG 2 nights before surgery and the night before surgery.  If you shower the day of surgery use CHG.  Use special wash - you have one bottle of CHG for all showers.  You should use approximately 1/3 of the bottle for each shower.   Please read over the following fact sheets that you were given: Pain Booklet, Coughing and Deep Breathing, MRSA Information and Surgical Site Infection Prevention

## 2012-09-21 MED ORDER — VANCOMYCIN HCL 10 G IV SOLR: 1000.0000 mg | INTRAVENOUS | Status: DC

## 2012-09-21 NOTE — Progress Notes (Signed)
Wednesday---orders are in epic, PCR is negative and has been noted in chart...Marland KitchenDA

## 2012-09-22 ENCOUNTER — Encounter (HOSPITAL_COMMUNITY)
Admission: RE | Disposition: A | Discharge status: Home / Self Care | Payer: Self-pay | Source: Ambulatory Visit | Attending: Orthopedic Surgery

## 2012-09-22 ENCOUNTER — Encounter (HOSPITAL_COMMUNITY): Payer: Self-pay | Admitting: *Deleted

## 2012-09-22 ENCOUNTER — Ambulatory Visit (HOSPITAL_COMMUNITY): Payer: Worker's Compensation

## 2012-09-22 ENCOUNTER — Encounter (HOSPITAL_COMMUNITY): Payer: Self-pay | Admitting: Anesthesiology

## 2012-09-22 ENCOUNTER — Observation Stay (HOSPITAL_COMMUNITY)
Admission: RE | Admit: 2012-09-22 | Discharge: 2012-09-23 | Disposition: A | Discharge status: Home / Self Care | Payer: Worker's Compensation | Source: Ambulatory Visit | Attending: Orthopedic Surgery | Admitting: Orthopedic Surgery

## 2012-09-22 ENCOUNTER — Ambulatory Visit (HOSPITAL_COMMUNITY): Payer: Worker's Compensation | Admitting: Anesthesiology

## 2012-09-22 DIAGNOSIS — M549 Dorsalgia, unspecified: Secondary | ICD-10-CM | POA: Insufficient documentation

## 2012-09-22 DIAGNOSIS — G894 Chronic pain syndrome: Principal | ICD-10-CM | POA: Diagnosis present

## 2012-09-22 DIAGNOSIS — I10 Essential (primary) hypertension: Secondary | ICD-10-CM | POA: Insufficient documentation

## 2012-09-22 DIAGNOSIS — M79609 Pain in unspecified limb: Secondary | ICD-10-CM | POA: Insufficient documentation

## 2012-09-22 DIAGNOSIS — F17200 Nicotine dependence, unspecified, uncomplicated: Secondary | ICD-10-CM | POA: Insufficient documentation

## 2012-09-22 DIAGNOSIS — Z01810 Encounter for preprocedural cardiovascular examination: Secondary | ICD-10-CM | POA: Insufficient documentation

## 2012-09-22 DIAGNOSIS — Z01812 Encounter for preprocedural laboratory examination: Secondary | ICD-10-CM | POA: Insufficient documentation

## 2012-09-22 HISTORY — PX: SPINAL CORD STIMULATOR INSERTION: SHX5378

## 2012-09-22 SURGERY — INSERTION, SPINAL CORD STIMULATOR, LUMBAR
Anesthesia: General | Site: Back | Wound class: Clean

## 2012-09-22 MED ORDER — MENTHOL 3 MG MT LOZG: 1.0000 | LOZENGE | OROMUCOSAL | Status: DC | PRN

## 2012-09-22 MED ORDER — BUPIVACAINE-EPINEPHRINE 0.25% -1:200000 IJ SOLN
INTRAMUSCULAR | Status: DC | PRN
  Administered 2012-09-22: 10 mL

## 2012-09-22 MED ORDER — DEXAMETHASONE SODIUM PHOSPHATE 4 MG/ML IJ SOLN
4.0000 mg | Freq: Four times a day (QID) | INTRAMUSCULAR | Status: DC
Start: 2012-09-23 — End: 2012-09-23
  Filled 2012-09-22 (×6): qty 1

## 2012-09-22 MED ORDER — METHOCARBAMOL 500 MG PO TABS: 500.0000 mg | ORAL_TABLET | Freq: Four times a day (QID) | ORAL | Status: DC | PRN

## 2012-09-22 MED ORDER — DEXAMETHASONE 4 MG PO TABS
4.0000 mg | ORAL_TABLET | Freq: Four times a day (QID) | ORAL | Status: DC
  Administered 2012-09-23 (×2): 4 mg via ORAL
  Filled 2012-09-22 (×6): qty 1

## 2012-09-22 MED ORDER — OXYCODONE HCL 5 MG/5ML PO SOLN: 5.0000 mg | Freq: Once | ORAL | Status: DC | PRN

## 2012-09-22 MED ORDER — ZOLPIDEM TARTRATE 5 MG PO TABS: 5.0000 mg | ORAL_TABLET | Freq: Every evening | ORAL | Status: DC | PRN

## 2012-09-22 MED ORDER — PAROXETINE HCL 20 MG PO TABS
20.0000 mg | ORAL_TABLET | Freq: Every morning | ORAL | Status: DC
  Administered 2012-09-23: 20 mg via ORAL
  Filled 2012-09-22: qty 1

## 2012-09-22 MED ORDER — MORPHINE SULFATE 2 MG/ML IJ SOLN: 1.0000 mg | INTRAMUSCULAR | Status: DC | PRN

## 2012-09-22 MED ORDER — SODIUM CHLORIDE 0.9 % IJ SOLN: 3.0000 mL | Freq: Two times a day (BID) | INTRAMUSCULAR | Status: DC

## 2012-09-22 MED ORDER — LACTATED RINGERS IV SOLN
INTRAVENOUS | Status: DC
  Administered 2012-09-23: 02:00:00 via INTRAVENOUS

## 2012-09-22 MED ORDER — FENTANYL CITRATE 0.05 MG/ML IJ SOLN
INTRAMUSCULAR | Status: DC | PRN
  Administered 2012-09-22 (×2): 50 ug via INTRAVENOUS
  Administered 2012-09-22: 100 ug via INTRAVENOUS
  Administered 2012-09-22: 50 ug via INTRAVENOUS
  Administered 2012-09-22: 100 ug via INTRAVENOUS
  Administered 2012-09-22: 50 ug via INTRAVENOUS
  Administered 2012-09-22: 100 ug via INTRAVENOUS

## 2012-09-22 MED ORDER — LIDOCAINE HCL (CARDIAC) 20 MG/ML IV SOLN
INTRAVENOUS | Status: DC | PRN
  Administered 2012-09-22: 100 mg via INTRAVENOUS

## 2012-09-22 MED ORDER — METOCLOPRAMIDE HCL 5 MG/ML IJ SOLN: 10.0000 mg | Freq: Once | INTRAMUSCULAR | Status: DC | PRN

## 2012-09-22 MED ORDER — VANCOMYCIN HCL IN DEXTROSE 1-5 GM/200ML-% IV SOLN
1000.0000 mg | Freq: Two times a day (BID) | INTRAVENOUS | Status: AC
  Administered 2012-09-22 – 2012-09-23 (×2): 1000 mg via INTRAVENOUS
  Filled 2012-09-22 (×2): qty 200

## 2012-09-22 MED ORDER — BUPIVACAINE-EPINEPHRINE PF 0.25-1:200000 % IJ SOLN
INTRAMUSCULAR | Status: AC
  Filled 2012-09-22: qty 30

## 2012-09-22 MED ORDER — SODIUM CHLORIDE 0.9 % IV SOLN: 250.0000 mL | INTRAVENOUS | Status: DC

## 2012-09-22 MED ORDER — METHOCARBAMOL 100 MG/ML IJ SOLN
500.0000 mg | Freq: Four times a day (QID) | INTRAVENOUS | Status: DC | PRN
  Filled 2012-09-22: qty 5

## 2012-09-22 MED ORDER — ONDANSETRON HCL 4 MG/2ML IJ SOLN
INTRAMUSCULAR | Status: DC | PRN
  Administered 2012-09-22: 4 mg via INTRAVENOUS

## 2012-09-22 MED ORDER — ACETAMINOPHEN 10 MG/ML IV SOLN
INTRAVENOUS | Status: AC
  Administered 2012-09-22: 1000 mg via INTRAVENOUS
  Filled 2012-09-22: qty 100

## 2012-09-22 MED ORDER — THROMBIN 20000 UNITS EX SOLR
CUTANEOUS | Status: AC
  Filled 2012-09-22: qty 20000

## 2012-09-22 MED ORDER — HEMOSTATIC AGENTS (NO CHARGE) OPTIME
TOPICAL | Status: DC | PRN
  Administered 2012-09-22: 1 via TOPICAL

## 2012-09-22 MED ORDER — VANCOMYCIN HCL IN DEXTROSE 1-5 GM/200ML-% IV SOLN
1000.0000 mg | INTRAVENOUS | Status: AC
  Administered 2012-09-22: 1000 mg via INTRAVENOUS
  Filled 2012-09-22 (×2): qty 200

## 2012-09-22 MED ORDER — ACETAMINOPHEN 10 MG/ML IV SOLN
1000.0000 mg | Freq: Four times a day (QID) | INTRAVENOUS | Status: DC
  Administered 2012-09-23 (×2): 1000 mg via INTRAVENOUS
  Filled 2012-09-22 (×4): qty 100

## 2012-09-22 MED ORDER — OXYCODONE HCL 5 MG PO TABS
10.0000 mg | ORAL_TABLET | ORAL | Status: DC | PRN
  Administered 2012-09-22 – 2012-09-23 (×2): 10 mg via ORAL
  Filled 2012-09-22 (×2): qty 2

## 2012-09-22 MED ORDER — HYDROMORPHONE HCL PF 1 MG/ML IJ SOLN
INTRAMUSCULAR | Status: AC
  Filled 2012-09-22: qty 1

## 2012-09-22 MED ORDER — SODIUM CHLORIDE 0.9 % IJ SOLN
3.0000 mL | INTRAMUSCULAR | Status: DC | PRN
  Administered 2012-09-22: 3 mL via INTRAVENOUS

## 2012-09-22 MED ORDER — OXYCODONE HCL 5 MG PO TABS
ORAL_TABLET | ORAL | Status: AC
  Administered 2012-09-22: 5 mg
  Filled 2012-09-22: qty 1

## 2012-09-22 MED ORDER — METHOCARBAMOL 500 MG PO TABS
ORAL_TABLET | ORAL | Status: AC
  Administered 2012-09-22: 500 mg
  Filled 2012-09-22: qty 1

## 2012-09-22 MED ORDER — ONDANSETRON HCL 4 MG/2ML IJ SOLN: 4.0000 mg | INTRAMUSCULAR | Status: DC | PRN

## 2012-09-22 MED ORDER — LACTATED RINGERS IV SOLN
INTRAVENOUS | Status: DC
  Administered 2012-09-22: 17:00:00 via INTRAVENOUS

## 2012-09-22 MED ORDER — METOPROLOL TARTRATE 25 MG PO TABS
25.0000 mg | ORAL_TABLET | Freq: Two times a day (BID) | ORAL | Status: DC
  Administered 2012-09-22 – 2012-09-23 (×2): 25 mg via ORAL
  Filled 2012-09-22 (×3): qty 1

## 2012-09-22 MED ORDER — ISOSORBIDE MONONITRATE ER 30 MG PO TB24
30.0000 mg | ORAL_TABLET | Freq: Every day | ORAL | Status: DC
  Administered 2012-09-22 – 2012-09-23 (×2): 30 mg via ORAL
  Filled 2012-09-22 (×2): qty 1

## 2012-09-22 MED ORDER — PROPOFOL 10 MG/ML IV BOLUS
INTRAVENOUS | Status: DC | PRN
  Administered 2012-09-22: 200 mg via INTRAVENOUS

## 2012-09-22 MED ORDER — PHENOL 1.4 % MT LIQD: 1.0000 | OROMUCOSAL | Status: DC | PRN

## 2012-09-22 MED ORDER — METOPROLOL TARTRATE 12.5 MG HALF TABLET
ORAL_TABLET | ORAL | Status: AC
  Administered 2012-09-22: 25 mg via ORAL
  Filled 2012-09-22: qty 2

## 2012-09-22 MED ORDER — MIDAZOLAM HCL 5 MG/5ML IJ SOLN
INTRAMUSCULAR | Status: DC | PRN
  Administered 2012-09-22: 2 mg via INTRAVENOUS

## 2012-09-22 MED ORDER — VANCOMYCIN HCL IN DEXTROSE 1-5 GM/200ML-% IV SOLN: 1000.0000 mg | INTRAVENOUS | Status: DC

## 2012-09-22 MED ORDER — OXYCODONE HCL 5 MG PO TABS: 5.0000 mg | ORAL_TABLET | Freq: Once | ORAL | Status: DC | PRN

## 2012-09-22 MED ORDER — LACTATED RINGERS IV SOLN
INTRAVENOUS | Status: DC | PRN
  Administered 2012-09-22 (×2): via INTRAVENOUS

## 2012-09-22 MED ORDER — METOPROLOL TARTRATE 25 MG PO TABS
25.0000 mg | ORAL_TABLET | Freq: Once | ORAL | Status: AC
  Filled 2012-09-22: qty 1

## 2012-09-22 MED ORDER — 0.9 % SODIUM CHLORIDE (POUR BTL) OPTIME
TOPICAL | Status: DC | PRN
  Administered 2012-09-22: 1000 mL

## 2012-09-22 MED ORDER — DEXAMETHASONE SODIUM PHOSPHATE 4 MG/ML IJ SOLN
4.0000 mg | Freq: Once | INTRAMUSCULAR | Status: AC
  Administered 2012-09-22: 10 mg via INTRAVENOUS
  Filled 2012-09-22: qty 1

## 2012-09-22 MED ORDER — ACETAMINOPHEN 10 MG/ML IV SOLN
1000.0000 mg | Freq: Once | INTRAVENOUS | Status: DC
  Filled 2012-09-22: qty 100

## 2012-09-22 MED ORDER — HYDROMORPHONE HCL PF 1 MG/ML IJ SOLN
0.2500 mg | INTRAMUSCULAR | Status: DC | PRN
  Administered 2012-09-22 (×3): 0.5 mg via INTRAVENOUS

## 2012-09-22 MED ORDER — NORTRIPTYLINE HCL 25 MG PO CAPS
100.0000 mg | ORAL_CAPSULE | Freq: Every day | ORAL | Status: DC
  Administered 2012-09-22: 100 mg via ORAL
  Filled 2012-09-22 (×2): qty 4

## 2012-09-22 MED ORDER — ROCURONIUM BROMIDE 100 MG/10ML IV SOLN
INTRAVENOUS | Status: DC | PRN
  Administered 2012-09-22: 50 mg via INTRAVENOUS

## 2012-09-22 SURGICAL SUPPLY — 56 items
CANISTER SUCTION 2500CC (MISCELLANEOUS) ×2 IMPLANT
CLOTH BEACON ORANGE TIMEOUT ST (SAFETY) ×2 IMPLANT
CLSR STERI-STRIP ANTIMIC 1/2X4 (GAUZE/BANDAGES/DRESSINGS) ×4 IMPLANT
CORDS BIPOLAR (ELECTRODE) ×2 IMPLANT
COVER PROBE W GEL 5X96 (DRAPES) IMPLANT
DRAPE C-ARM 42X72 X-RAY (DRAPES) ×2 IMPLANT
DRAPE INCISE IOBAN 85X60 (DRAPES) ×6 IMPLANT
DRAPE POUCH INSTRU U-SHP 10X18 (DRAPES) ×2 IMPLANT
DRAPE SURG 17X23 STRL (DRAPES) ×6 IMPLANT
DRAPE U-SHAPE 47X51 STRL (DRAPES) ×4 IMPLANT
DRSG MEPILEX BORDER 4X4 (GAUZE/BANDAGES/DRESSINGS) ×2 IMPLANT
DRSG MEPILEX BORDER 4X8 (GAUZE/BANDAGES/DRESSINGS) ×2 IMPLANT
DURAPREP 26ML APPLICATOR (WOUND CARE) ×6 IMPLANT
ELECT CAUTERY BLADE 6.4 (BLADE) ×2 IMPLANT
ELECT REM PT RETURN 9FT ADLT (ELECTROSURGICAL) ×2
ELECTRODE REM PT RTRN 9FT ADLT (ELECTROSURGICAL) ×1 IMPLANT
GLOVE BIOGEL PI IND STRL 8.5 (GLOVE) ×2 IMPLANT
GLOVE BIOGEL PI INDICATOR 8.5 (GLOVE) ×2
GLOVE ECLIPSE 8.5 STRL (GLOVE) ×4 IMPLANT
GOWN PREVENTION PLUS XXLARGE (GOWN DISPOSABLE) ×4 IMPLANT
GOWN STRL REIN XL XLG (GOWN DISPOSABLE) IMPLANT
KIT BASIN OR (CUSTOM PROCEDURE TRAY) ×2 IMPLANT
KIT ROOM TURNOVER OR (KITS) ×2 IMPLANT
LEAD KIT (Neuro Prosthesis/Implant) ×2 IMPLANT
MEDTRONIC 3550-69 POCKET SIZER ×2 IMPLANT
MEDTRONIC CHARGING SYSTEM ×2 IMPLANT
MEDTRONIC PATIENT PROGRAMMER ×2 IMPLANT
NDL SUT 6 .5 CRC .975X.05 MAYO (NEEDLE) ×1 IMPLANT
NEEDLE 22X1 1/2 (OR ONLY) (NEEDLE) ×2 IMPLANT
NEEDLE MAYO TAPER (NEEDLE) ×1
NEEDLE SPNL 18GX3.5 QUINCKE PK (NEEDLE) ×6 IMPLANT
NS IRRIG 1000ML POUR BTL (IV SOLUTION) ×2 IMPLANT
PACK LAMINECTOMY ORTHO (CUSTOM PROCEDURE TRAY) ×2 IMPLANT
PACK UNIVERSAL I (CUSTOM PROCEDURE TRAY) ×2 IMPLANT
PAD ARMBOARD 7.5X6 YLW CONV (MISCELLANEOUS) ×6 IMPLANT
PASSER CATH 38CM DISP (CATHETERS) ×2 IMPLANT
PROGRAMMER PATIENT (MISCELLANEOUS) ×2 IMPLANT
SPONGE LAP 4X18 X RAY DECT (DISPOSABLE) ×4 IMPLANT
SPONGE SURGIFOAM ABS GEL 100 (HEMOSTASIS) ×2 IMPLANT
STAPLER VISISTAT 35W (STAPLE) ×4 IMPLANT
STIMULATOR SURESCAN SPINAL (Neurostimulator) ×2 IMPLANT
STRIP CLOSURE SKIN 1/2X4 (GAUZE/BANDAGES/DRESSINGS) ×2 IMPLANT
SURGIFLO TRUKIT (HEMOSTASIS) ×2 IMPLANT
SUT FIBERWIRE #2 38 REV NDL BL (SUTURE) ×2
SUT MNCRL AB 3-0 PS2 18 (SUTURE) ×4 IMPLANT
SUT VIC AB 1 CT1 27 (SUTURE) ×2
SUT VIC AB 1 CT1 27XBRD ANBCTR (SUTURE) ×2 IMPLANT
SUT VIC AB 2-0 CT1 18 (SUTURE) ×4 IMPLANT
SUTURE FIBERWR#2 38 REV NDL BL (SUTURE) ×1 IMPLANT
SYR BULB IRRIGATION 50ML (SYRINGE) ×2 IMPLANT
SYR CONTROL 10ML LL (SYRINGE) ×2 IMPLANT
SYSTEM RECHARG SURESCAN SPINE (Neurostimulator) ×2 IMPLANT
TOWEL OR 17X24 6PK STRL BLUE (TOWEL DISPOSABLE) ×2 IMPLANT
TOWEL OR 17X26 10 PK STRL BLUE (TOWEL DISPOSABLE) ×2 IMPLANT
TRAY FOLEY CATH 14FR (SET/KITS/TRAYS/PACK) IMPLANT
WATER STERILE IRR 1000ML POUR (IV SOLUTION) IMPLANT

## 2012-09-22 NOTE — Anesthesia Postprocedure Evaluation (Signed)
  Anesthesia Post-op Note  Patient: [name]  Procedure(s) Performed: Procedure(s): LUMBAR SPINAL CORD STIMULATOR INSERTION (N/A)  Patient Location: PACU  Anesthesia Type:General  Level of Consciousness: awake  Airway and Oxygen Therapy: Patient Spontanous Breathing  Post-op Pain: mild  Post-op Assessment: Post-op Vital signs reviewed, Patient's Cardiovascular Status Stable, Respiratory Function Stable, Patent Airway, No signs of Nausea or vomiting, Adequate PO intake and Pain level controlled  Post-op Vital Signs: Reviewed and stable  Complications: No apparent anesthesia complications

## 2012-09-22 NOTE — Anesthesia Preprocedure Evaluation (Addendum)
Anesthesia Evaluation  Patient identified by MRN, date of birth, ID band Patient awake    Reviewed: Allergy & Precautions, H&P , NPO status , Patient's Chart, lab work & pertinent test results, reviewed documented beta blocker date and time   Airway Mallampati: II TM Distance: >3 FB Neck ROM: full    Dental  (+) Edentulous Upper, Edentulous Lower and Dental Advisory Given   Pulmonary Current Smoker,  breath sounds clear to auscultation        Cardiovascular hypertension, On Medications and On Home Beta Blockers + angina Rhythm:regular     Neuro/Psych  Neuromuscular disease negative neurological ROS  negative psych ROS   GI/Hepatic Neg liver ROS, GERD-  Medicated,  Endo/Other  negative endocrine ROS  Renal/GU negative Renal ROS  negative genitourinary   Musculoskeletal   Abdominal   Peds  Hematology negative hematology ROS (+)   Anesthesia Other Findings See surgeon's H&P   Reproductive/Obstetrics negative OB ROS                          Anesthesia Physical Anesthesia Plan  ASA: II  Anesthesia Plan: General   Post-op Pain Management:    Induction: Intravenous  Airway Management Planned: Oral ETT  Additional Equipment:   Intra-op Plan:   Post-operative Plan: Extubation in OR  Informed Consent: I have reviewed the patients History and Physical, chart, labs and discussed the procedure including the risks, benefits and alternatives for the proposed anesthesia with the patient or authorized representative who has indicated his/her understanding and acceptance.   Dental Advisory Given and Dental advisory given  Plan Discussed with: CRNA, Surgeon and Anesthesiologist  Anesthesia Plan Comments:        Anesthesia Quick Evaluation

## 2012-09-22 NOTE — H&P (Signed)
No change in clinical exam H+P reviewed  

## 2012-09-22 NOTE — Anesthesia Procedure Notes (Signed)
Procedure Name: Intubation Date/Time: 09/22/2012 5:39 PM Performed by: Brien Mates DOBSON Pre-anesthesia Checklist: Patient identified, Emergency Drugs available, Suction available, Patient being monitored and Timeout performed Patient Re-evaluated:Patient Re-evaluated prior to inductionOxygen Delivery Method: Circle system utilized Preoxygenation: Pre-oxygenation with 100% oxygen Intubation Type: IV induction Ventilation: Mask ventilation without difficulty and Oral airway inserted - appropriate to patient size Laryngoscope Size: Miller and 2 Grade View: Grade I Tube type: Oral Tube size: 7.5 mm Number of attempts: 1 Airway Equipment and Method: Stylet Placement Confirmation: ETT inserted through vocal cords under direct vision,  positive ETCO2 and breath sounds checked- equal and bilateral Secured at: 23 cm Tube secured with: Tape Dental Injury: Teeth and Oropharynx as per pre-operative assessment

## 2012-09-22 NOTE — Transfer of Care (Signed)
Immediate Anesthesia Transfer of Care Note  Patient: [name]  Procedure(s) Performed: Procedure(s): LUMBAR SPINAL CORD STIMULATOR INSERTION (N/A)  Patient Location: PACU  Anesthesia Type:General  Level of Consciousness: sedated  Airway & Oxygen Therapy: Patient Spontanous Breathing and Patient connected to face mask oxygen  Post-op Assessment: Report given to PACU RN and Post -op Vital signs reviewed and stable  Post vital signs: Reviewed and stable  Complications: No apparent anesthesia complications

## 2012-09-22 NOTE — Preoperative (Signed)
Beta Blockers   Reason not to administer Beta Blockers:Not Applicable 

## 2012-09-22 NOTE — Brief Op Note (Signed)
09/22/2012  7:54 PM  PATIENT:  [name]  37 y.o. male  PRE-OPERATIVE DIAGNOSIS:  chronic back pain    POST-OPERATIVE DIAGNOSIS:  chronic pain back  PROCEDURE:  Procedure(s): LUMBAR SPINAL CORD STIMULATOR INSERTION (N/A)  SURGEON:  Surgeon(s) and Role:    * Venita Lick, MD - Primary  PHYSICIAN ASSISTANT:   ASSISTANTS: none   ANESTHESIA:   general  EBL:  Total I/O In: 500 [I.V.:500] Out: -   BLOOD ADMINISTERED:none  DRAINS: none and Gastrostomy Tube   LOCAL MEDICATIONS USED:  MARCAINE     SPECIMEN:  No Specimen  DISPOSITION OF SPECIMEN:  N/A  COUNTS:  YES  TOURNIQUET:  * No tourniquets in log *  DICTATION: .Other Dictation: Dictation Number [id number]  PLAN OF CARE: Admit for overnight observation  PATIENT DISPOSITION:  PACU - hemodynamically stable.

## 2012-09-23 MED ORDER — POLYETHYLENE GLYCOL 3350 17 GM/SCOOP PO POWD: 17.0000 g | Freq: Every day | ORAL | Status: DC

## 2012-09-23 MED ORDER — ONDANSETRON HCL 4 MG PO TABS: 4.0000 mg | ORAL_TABLET | Freq: Three times a day (TID) | ORAL | Status: DC | PRN

## 2012-09-23 MED ORDER — OXYCODONE-ACETAMINOPHEN 10-325 MG PO TABS: 1.0000 | ORAL_TABLET | ORAL | Status: DC | PRN

## 2012-09-23 MED ORDER — DOCUSATE SODIUM 100 MG PO CAPS: 100.0000 mg | ORAL_CAPSULE | Freq: Three times a day (TID) | ORAL | Status: DC | PRN

## 2012-09-23 NOTE — Op Note (Signed)
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  OPERATIVE PROCEDURE:  Implantation of spinal cord stimulator.  IMPLANT USED:  Medtronic spinal cord stimulator, 2 x 8.  COMPLICATIONS:  None.  CONDITION:  Stable.  HISTORY:  This is a very pleasant gentleman who is well known to me. Unfortunately, continued to have significant pain despite multiple lumbar spinal procedures.  He was ultimately seen and treated by pain management specialist and had a trial spinal cord stimulator.  This caused significant increase in his quality of life and improvement. Because of the success of the trial, he presented to me for permanent implantation.  All appropriate risks, benefits, and alternatives were discussed with the patient and consent was obtained.  OPERATIVE NOTE:  The patient was brought to the operating room and placed supine on the operating table.  After successful induction of general anesthesia and endotracheal intubation, TEDs and SCDs were applied.  He was turned prone onto the [place] frame.  All bony prominences were well-padded and the back was prepped and draped in a standard fashion.  Time-out was then done to confirm patient, procedure, and all other pertinent important data.  Once this was completed, I then counted up from the lumbar spine using a fluoro on the lateral plane.  I identified the T9 vertebral body and marked it.  I then infiltrated the skin incision and made incision starting at the superior aspect of the T9 pedicle proceeding to the inferior  aspect of the T10 spinous process. This was a midline incision, sharply dissected down to the deep fascia. Deep fascia was sharply incised and I exposed the spinous process at T9 and T10 using a Cobb elevator.  Hemostasis was obtained using bipolar electrocautery, and then I placed self-retaining retractor.  I then counted again in the lateral plane from L5 to ensure that I had the T9 vertebral body.  I then confirmed with AP using T12 as the last rib- bearing vertebrae and counted up and again confirmed that that was T9. Once I had confirmed this, I then used a double-action Leksell rongeur to remove the spinous process of T9.  I then used three neuro curettes to develop a plane between the ligamentum flavum and the lamina.  I then used a 2- and 3-mm Kerrison to perform a generous laminotomy of T9.  I then used a Penfield 4 to dissect through the central raphe in the ligament and create a plane between the ligamentum flavum and the dura. I then used a 2-mm Kerrison to resect the ligamentum flavum and exposed the dorsal surface of the thecal sac.  Once this was done, I used a dural elevator to pass superiorly to create a space for the implant.  It  went without difficulty.  I then obtained the Medtronic spinal cord stimulator dorsal column lead and advanced it so that it was just below the T7 pedicle and spanning the entire T8 vertebral body and it was in the midline.  According to the representative, this was the exact correct position.  Satisfied with this.  I then placed the plastic strain gauges over the wire and then sutured them directly to the T10 vertebral body through bone holes with a FiberWire.  I then looped it around the T10-T11 through the interspinous process ligament.  I then made a counter incision on the left lower side where he had one of the battery placed.  I created a cavity 2.5 cm down.  Hemostasis was obtained.  I then used the transporting device and I then passed  the wire sub-muscularly from the thoracic to the gluteal wound.  I then attached it to the battery.  I then torqued down all of the leads.  Once they were torqued down, I put it into the cavity and then we tested it. The batteries and the leads were working without problems.  I then sutured the battery with a #1 Vicryl down to the deep fascia.  I then irrigated both of these wounds copiously with normal saline, made sure I had hemostasis using bipolar electrocautery.  I closed the deep fascia on each wound with interrupted #1 Vicryl sutures, superficial with 2-0 Vicryl sutures, and 3-0 Monocryl for the skin.  Steri-Strips and dry dressing were applied.  The patient was ultimately extubated and transferred to the PACU without incident.  At the end of the case, all needle and sponge counts were correct.  There was no adverse intraoperative events.     Alvy Beal, MD     DDB/MEDQ  D:  09/22/2012  T:  09/23/2012  Job:  [id number]

## 2012-09-23 NOTE — Progress Notes (Signed)
    Subjective: Procedure(s) (LRB): LUMBAR SPINAL CORD STIMULATOR INSERTION (N/A) 1 Day Post-Op  Patient reports pain as 4 on 0-10 scale.  Reports decreased leg pain reports incisional back pain   Positive void Negative bowel movement Positive flatus Negative chest pain or shortness of breath  Objective: Vital signs in last 24 hours: Temp:  [98.1 F (36.7 C)-98.8 F (37.1 C)] 98.4 F (36.9 C) (04/18 0624) Pulse Rate:  [60-86] 60 (04/18 0624) Resp:  [13-21] 16 (04/18 0624) BP: (105-136)/(45-93) 105/45 mmHg (04/18 0624) SpO2:  [95 %-100 %] 98 % (04/18 0624) Weight:  [69.4 kg (153 lb)] 69.4 kg (153 lb) (04/18 0848)  Intake/Output from previous day: 04/17 0701 - 04/18 0700 In: 2601.2 [P.O.:60; I.V.:2241.2; IV Piggyback:300] Out: 650 [Urine:600; Blood:50]  Labs:  Recent Labs  09/20/12 1630  WBC 11.0*  RBC 4.58  HCT 38.5*  PLT 317    Recent Labs  09/20/12 1630  NA 139  K 4.2  CL 100  CO2 31  BUN 10  CREATININE 0.78  GLUCOSE 99  CALCIUM 9.8   No results found for this basename: LABPT, INR,  in the last 72 hours  Physical Exam: Neurologically intact ABD soft Neurovascular intact Incision: dressing C/D/I and no drainage Compartment soft  Assessment/Plan: Patient stable  xrays n/a Continue mobilization with physical therapy Continue care  Advance diet D/C IV fluids Plan on d/c today  Venita Lick, MD [facility] [phone]

## 2012-09-23 NOTE — Discharge Summary (Signed)
Patient ID: [name] MRN: [id number] DOB/AGE: [birth year] 37 y.o.  Admit date: 09/22/2012 Discharge date: 09/23/2012  Admission Diagnoses:  Active Problems:   * No active hospital problems. *   Discharge Diagnoses:  Active Problems:   * No active hospital problems. *  status post Procedure(s): LUMBAR SPINAL CORD STIMULATOR INSERTION  Past Medical History  Diagnosis Date  . GERD (gastroesophageal reflux disease)   . Spine injury 04-14-2010    Workmen's comp injury date  . Arthritis     failed back syndrome   . Anginal pain     LAST PAIN 08/2012 TO SEE CARDIOLOGIST  . Hypertension     Surgeries: Procedure(s): LUMBAR SPINAL CORD STIMULATOR INSERTION on 09/22/2012   Consultants:  none  Discharged Condition: Improved  Hospital Course: [name] is an 37 y.o. male who was admitted 09/22/2012 for operative treatment of Chronic pain syndrome. Patient failed conservative treatments (please see the history and physical for the specifics) and had severe unremitting pain that affects sleep, daily activities and work/hobbies. After pre-op clearance, the patient was taken to the operating room on 09/22/2012 and underwent  Procedure(s): LUMBAR SPINAL CORD STIMULATOR INSERTION.    Patient was given perioperative antibiotics: Anti-infectives   Start     Dose/Rate Route Frequency Ordered Stop   09/23/12 0600  vancomycin (VANCOCIN) IVPB 1000 mg/200 mL premix  Status:  Discontinued     1,000 mg 200 mL/hr over 60 Minutes Intravenous On call to O.R. 09/22/12 1341 09/22/12 1346   09/22/12 2145  vancomycin (VANCOCIN) IVPB 1000 mg/200 mL premix     1,000 mg 200 mL/hr over 60 Minutes Intravenous Every 12 hours 09/22/12 2135 09/23/12 2144   09/22/12 1430  vancomycin (VANCOCIN) IVPB 1000 mg/200 mL premix     1,000 mg 200 mL/hr over 60 Minutes Intravenous On call to O.R. 09/22/12 1346 09/22/12 1722   09/22/12 0600  vancomycin (VANCOCIN) 1,000 mg in sodium chloride 0.9 % 500 mL IVPB   Status:  Discontinued     1,000 mg 250 mL/hr over 120 Minutes Intravenous On call to O.R. 09/21/12 1451 09/21/12 1452   09/22/12 0500  vancomycin (VANCOCIN) 1,000 mg in sodium chloride 0.9 % 500 mL IVPB  Status:  Discontinued     1,000 mg 250 mL/hr over 120 Minutes Intravenous On call to O.R. 09/21/12 1445 09/21/12 1449       Patient was given sequential compression devices and early ambulation to prevent DVT.   Patient benefited maximally from hospital stay and there were no complications. At the time of discharge, the patient was urinating/moving their bowels without difficulty, tolerating a regular diet, pain is controlled with oral pain medications and they have been cleared by PT/OT.   Recent vital signs: Patient Vitals for the past 24 hrs:  BP Temp Temp src Pulse Resp SpO2 Height Weight  09/23/12 0848 - - - - - - 5\' 11"  (1.803 m) 69.4 kg (153 lb)  09/23/12 0624 105/45 mmHg 98.4 F (36.9 C) Oral 60 16 98 % - -  09/23/12 0157 125/76 mmHg 98.6 F (37 C) Oral 80 16 98 % - -  09/22/12 2129 115/78 mmHg 98.3 F (36.8 C) Oral 86 16 97 % - -  09/22/12 2103 118/84 mmHg 98.8 F (37.1 C) - 72 13 95 % - -  09/22/12 2050 128/88 mmHg - - 69 15 97 % - -  09/22/12 2033 136/93 mmHg - - 75 18 98 % - -  09/22/12  2025 - - - 76 16 99 % - -  09/22/12 2017 132/88 mmHg - - - - - - -  09/22/12 2015 - - - 78 21 100 % - -  09/22/12 2003 131/90 mmHg - - - - - - -  09/22/12 2000 - 98.3 F (36.8 C) - - 18 100 % - -  09/22/12 1351 125/75 mmHg 98.1 F (36.7 C) Oral 82 16 100 % - -     Recent laboratory studies:  Recent Labs  09/20/12 1630  WBC 11.0*  HGB 14.0  HCT 38.5*  PLT 317  NA 139  K 4.2  CL 100  CO2 31  BUN 10  CREATININE 0.78  GLUCOSE 99  CALCIUM 9.8     Discharge Medications:     Medication List    STOP taking these medications       Oxycodone HCl 10 MG Tabs      TAKE these medications       aspirin EC 81 MG tablet  Take 81 mg by mouth daily.     docusate sodium 100  MG capsule  Commonly known as:  COLACE  Take 1 capsule (100 mg total) by mouth 3 (three) times daily as needed for constipation.     isosorbide mononitrate 30 MG 24 hr tablet  Commonly known as:  IMDUR  Take 30 mg by mouth daily.     metoprolol tartrate 25 MG tablet  Commonly known as:  LOPRESSOR  Take 25 mg by mouth 2 (two) times daily.     multivitamin with minerals Tabs  Take 1 tablet by mouth daily.     nortriptyline 50 MG capsule  Commonly known as:  PAMELOR  Take 100 mg by mouth at bedtime.     ondansetron 4 MG tablet  Commonly known as:  ZOFRAN  Take 1 tablet (4 mg total) by mouth every 8 (eight) hours as needed for nausea.     oxyCODONE-acetaminophen 10-325 MG per tablet  Commonly known as:  PERCOCET  Take 1 tablet by mouth every 4 (four) hours as needed for pain.     PARoxetine 20 MG tablet  Commonly known as:  PAXIL  Take 20 mg by mouth every morning.     polyethylene glycol powder powder  Commonly known as:  GLYCOLAX  Take 17 g by mouth daily.        Diagnostic Studies: Dg Chest 2 View  09/20/2012  *RADIOLOGY REPORT*  Clinical Data: Hypertension; preoperative for spinal cord stimulator  CHEST - 2 VIEW  Comparison: August 10, 2011  Findings: There is a degree of underlying emphysema.  Lungs are clear.  Heart size and pulmonary vascularity are normal.  No adenopathy. No bone lesions.  IMPRESSION: No edema or consolidation.  Evidence of a degree of underlying emphysematous change.   Original Report Authenticated By: Bretta Bang, M.D.    Dg Thoracic Spine 2 View  09/22/2012  *RADIOLOGY REPORT*  Clinical Data: Placement of spinal cord stimulator leads  THORACIC SPINE - 2 VIEW  Comparison: None.  Findings: Two C-arm spot films show placement of neurostimulator leads into the region of the mid lower thoracic spinal canal.  IMPRESSION: Placement of neurostimulator leads.   Original Report Authenticated By: Dwyane Dee, M.D.    Dg C-arm [phone] Min  09/22/2012   *RADIOLOGY REPORT*  Clinical Data: Insertion of the spinal cord stimulator  DG C-ARM 61-120 MIN  Comparison:  Chest x-ray of 09/20/2012  Findings: Two C-arm spot films were  returned.  These images demonstrate placement of the neurostimulator leads into the region of the mid thoracic spinal canal.  IMPRESSION: Placement of spinal cord stimulator leads.   Original Report Authenticated By: Dwyane Dee, M.D.           Follow-up Information   Follow up with Alvy Beal, MD. Call in 2 weeks. (As needed if symptoms worsen)    Contact information:   [address], [address] [place] [address] [phone]       Discharge Plan:  discharge to home  Disposition: stable    Signed: Venita Lick D for Dr. Venita Lick [facility] [phone] 09/23/2012, 9:36 AM

## 2012-09-23 NOTE — Progress Notes (Signed)
UR COMPLETED  

## 2012-09-23 NOTE — Progress Notes (Signed)
09/23/12 1504  PT Visit Information  Last PT Received On 09/23/12  Assistance Needed +1  PT/OT Co-Evaluation/Treatment Yes  PT Time Calculation  PT Start Time 1049  PT Stop Time 1120  PT Time Calculation (min) 31 min  Subjective Data  Subjective I didn't think I would have to have more back surgery (following his fusion last year)  Patient Stated Goal reduced pain  Precautions  Precautions Back  Precaution Booklet Issued Yes (comment)  Precaution Comments pt able to recall 3/3 back precautions, previous back surgery March 2013  Required Braces or Orthoses Spinal Brace  Spinal Brace Lumbar corset  Restrictions  Weight Bearing Restrictions No  Home Living  Lives With Spouse  Available Help at Discharge Family;Available 24 hours/day  Type of Home House  Home Access Stairs to enter  Entrance Stairs-Number of Steps 3  Entrance Stairs-Rails None  Home Layout Two level;Able to live on main level with bedroom/bathroom;Full bath on main level  Bathroom Shower/Tub Tub/shower unit;Curtain  Horticulturist, commercial Yes  How Accessible Accessible via walker  Home Pension scheme manager  Additional Comments pt. said he has had equipment in the passt but has gotten rid of it.  Prior Function  Level of Independence Independent  Able to Take Stairs? Yes  Driving No  Communication  Communication No difficulties  Cognition  Arousal/Alertness Awake/alert  Behavior During Therapy WFL for tasks assessed/performed  Overall Cognitive Status Within Functional Limits for tasks assessed  Right Upper Extremity Assessment  RUE ROM/Strength/Tone WFL for tasks assessed  Left Upper Extremity Assessment  LUE ROM/Strength/Tone WFL for tasks assessed  Right Lower Extremity Assessment  RLE ROM/Strength/Tone WFL for tasks assessed  RLE Sensation WFL - Light Touch  RLE Coordination WFL - gross motor  Left Lower Extremity Assessment  LLE ROM/Strength/Tone WFL for tasks  assessed  LLE Sensation WFL - Light Touch  LLE Coordination WFL - gross motor  Trunk Assessment  Trunk Assessment Normal  Bed Mobility  Bed Mobility Not assessed (pt. seated at EOB upon PT/OT entry)  Transfers  Transfers Sit to Stand;Stand to Sit  Sit to Stand 5: Supervision;4: Min guard;From bed;From toilet;From chair/3-in-1  Stand to Sit 5: Supervision;4: Min guard;To bed;To chair/3-in-1;To toilet  Details for Transfer Assistance supervision for safety; pt. managing well   Ambulation/Gait  Ambulation/Gait Assistance 6: Modified independent (Device/Increase time)  Ambulation Distance (Feet) 150 Feet  Assistive device Rolling walker  Ambulation/Gait Assistance Details no overt LOB and pt. appears steady with RW.  He says he will use RW at home.    Gait Pattern Step-through pattern  Gait velocity slowed  Stairs Yes  Stairs Assistance 6: Modified independent (Device/Increase time)  Stair Management Technique No rails  Number of Stairs 3  PT - End of Session  Equipment Utilized During Treatment Gait belt;Back brace  Activity Tolerance Patient tolerated treatment well  Patient left in chair  Nurse Communication Mobility status  PT Assessment  Clinical Impression Statement Pt. is familiar to this therapist from prior admission.  He presents s/p lumbar spinal stimulator for failed back syndrome.  He is mobilizing well and is able to state and generalize his back precautions. He has loaned out his RW but says he will get it back.  Expect he will progress off RW fairly quickly but do recommned hi use it initially.  No further acute PT needs identified.  willl sign off.  PT Recommendation/Assessment Patent does not need any further PT services  No Skilled PT All  education completed;Patient will have necessary level of assist by caregiver at discharge;Patient is modified independent with all activity/mobility  PT Recommendation  Follow Up Recommendations No PT follow up  PT equipment None  recommended by PT;Other (comment) (has RW that he has access to)  PT G-Codes **NOT FOR INPATIENT CLASS**  Functional Assessment Tool Used clinical judgement  Functional Limitation Mobility: Walking and moving around  Mobility: Walking and Moving Around Current Status (W0981) CI  Mobility: Walking and Moving Around Goal Status (X9147) CI  Mobility: Walking and Moving Around Discharge Status (W2956) CI  PT General Charges  $$ ACUTE PT VISIT 1 Procedure  PT Evaluation  $Initial PT Evaluation Tier I 1 Procedure  PT Treatments  $Gait Training 8-22 mins  $Therapeutic Activity 8-22 mins  Written Expression  Dominant Hand Right  Weldon Picking PT Acute Rehab Services [phone] Beeper [phone]

## 2012-09-23 NOTE — Care Management Note (Signed)
CARE MANAGEMENT NOTE 09/23/2012  Patient:  [name], [name]   Account Number:  [id number]  Date Initiated:  09/23/2012  Documentation initiated by:  Vance Peper  Subjective/Objective Assessment:   37 yr old male s/p placement of spinal cord stimulator.     Action/Plan:   CM spoke with patient concerning need for rolling walker. He is under worker's comp, received RW in past yr, but loaned it out. CM informed him that Worker's comp office is closed andhe  needs to get RW back. States he will call the person he loaned walker to. Worker's comp:[phone]   Anticipated DC Date:  09/23/2012   Anticipated DC Plan:  HOME/SELF CARE      DC Planning Services  CM consult      PAC Choice  DURABLE MEDICAL EQUIPMENT   Choice offered to / List presented to:     DME arranged  OTHER - SEE COMMENT           Status of service:  Completed, signed off Medicare Important Message given?   (If response is "NO", the following Medicare IM given date fields will be blank) Date Medicare IM given:   Date Additional Medicare IM given:    Discharge Disposition:  HOME/SELF CARE  Per UR Regulation:    If discussed at Long Length of Stay Meetings, dates discussed:    Comments:

## 2012-09-23 NOTE — Progress Notes (Signed)
Pt discharged home via family; Pt and family given and explained all discharge instructions, carenotes, and prescriptions; pt and family stated understanding and denied questions/concerns; all f/u appointments in place; IV removed without complicaitons; pt stable at time of discharge; stimulator activated per dr. Shon Baton in PACU; pt is aware all information at bedside

## 2012-09-23 NOTE — Evaluation (Signed)
Occupational Therapy Evaluation Patient Details Name: [name] MRN: [id number] DOB: [birth year] Today's Date: 09/23/2012 Time: 7829-5621 OT Time Calculation (min): 30 min  OT Assessment / Plan / Recommendation Clinical Impression  Pt demos decline in function with LB ADLs and ADL mobility safety following insertion of lumbar spinal cord stimulator. Pt doing well and will have 24 hour assist and supervision at home. All education completed and no further acute OT services needed    OT Assessment  Patient does not need any further OT services    Follow Up Recommendations  No OT follow up;Supervision/Assistance - 24 hour    Barriers to Discharge  None    Equipment Recommendations   None   Recommendations for Other Services  None  Frequency       Precautions / Restrictions Precautions Precautions: Back Precaution Booklet Issued: Yes (comment) Precaution Comments: pt able to recall 3/3 back precautions, previous back surgery March 2013 Restrictions Weight Bearing Restrictions: No   Pertinent Vitals/Pain 7/10 pain at surgical incision area    ADL  Grooming: Performed;Wash/dry hands;Wash/dry face;Supervision/safety;Set up Where Assessed - Grooming: Supported standing Upper Body Bathing: Simulated;Supervision/safety;Set up Lower Body Bathing: Simulated;Moderate assistance Upper Body Dressing: Performed;Supervision/safety;Set up;Other (comment) (assist to donn brace) Lower Body Dressing: Performed;Moderate assistance Toilet Transfer: Performed;Supervision/safety;Min Pension scheme manager Method: Sit to stand Toilet Transfer Equipment: Regular height toilet;Raised toilet seat with arms (or 3-in-1 over toilet) Toileting - Clothing Manipulation and Hygiene: Performed;Min guard;Supervision/safety Where Assessed - Engineer, mining and Hygiene: Standing Tub/Shower Transfer: Min guard;Performed Tub/Shower Transfer Method: Ambulating Equipment Used: Back brace;Gait  belt;Rolling walker ADL Comments: pt has ADL A/E kit at home from orevious back surgery, had tub bench and RW and no longer has. Pt able to step over into tub shower with min guard A    OT Diagnosis:    OT Problem List:   OT Treatment Interventions:     OT Goals    Visit Information  Last OT Received On: 09/23/12    Subjective Data  Subjective: " I had a back fusion March 2013 " Patient Stated Goal: To retrun home   Prior Functioning     Home Living Lives With: Spouse Available Help at Discharge: Family;Available 24 hours/day Type of Home: House Home Access: Stairs to enter [employer] of Steps: 3 Entrance Stairs-Rails: None Home Layout: Two level;Able to live on main level with bedroom/bathroom;Full bath on main level Bathroom Shower/Tub: Tub/shower unit;Curtain Bathroom Toilet: Standard Bathroom Accessibility: Yes Home Adaptive Equipment: Reacher Prior Function Level of Independence: Independent Driving: No Communication Communication: No difficulties Dominant Hand: Right         Vision/Perception Vision - History Baseline Vision: No visual deficits Patient Visual Report: No change from baseline Perception Perception: Within Functional Limits   Cognition  Cognition Arousal/Alertness: Awake/alert Behavior During Therapy: WFL for tasks assessed/performed Overall Cognitive Status: Within Functional Limits for tasks assessed    Extremity/Trunk Assessment Right Upper Extremity Assessment RUE ROM/Strength/Tone: WFL for tasks assessed RUE Coordination: WFL - gross/fine motor Left Upper Extremity Assessment LUE ROM/Strength/Tone: WFL for tasks assessed LUE Coordination: WFL - gross/fine motor     Mobility Bed Mobility Bed Mobility: Not assessed Transfers Transfers: Sit to Stand;Stand to Sit Sit to Stand: 5: Supervision;4: Min guard;From bed;From toilet;From chair/3-in-1 Stand to Sit: 5: Supervision;4: Min guard;To bed;To chair/3-in-1;To  toilet     Exercise     Balance Balance Balance Assessed: No   End of Session OT - End of Session Equipment Utilized During Treatment: Gait  belt;Back brace (RW) Activity Tolerance: Patient tolerated treatment well Patient left: in chair;with call bell/phone within reach;with family/visitor present  GO Functional Limitation: Self care Self Care Current Status (Z6109): At least 20 percent but less than 40 percent impaired, limited or restricted Self Care Goal Status (U0454): At least 1 percent but less than 20 percent impaired, limited or restricted Self Care Discharge Status [phone]): At least 1 percent but less than 20 percent impaired, limited or restricted   Galen Manila 09/23/2012, 1:35 PM

## 2012-09-27 ENCOUNTER — Encounter (HOSPITAL_COMMUNITY): Payer: Self-pay | Admitting: Orthopedic Surgery

## 2013-03-21 IMAGING — CR DG CHEST 2V
2 series · 2 of 2 positions shown · non-contrast
Comparison: None.

CLINICAL DATA: Smoking history.  Preoperative study for lumbar
fusion.

CHEST - 2 VIEW

[view not recorded (1 of 2)]
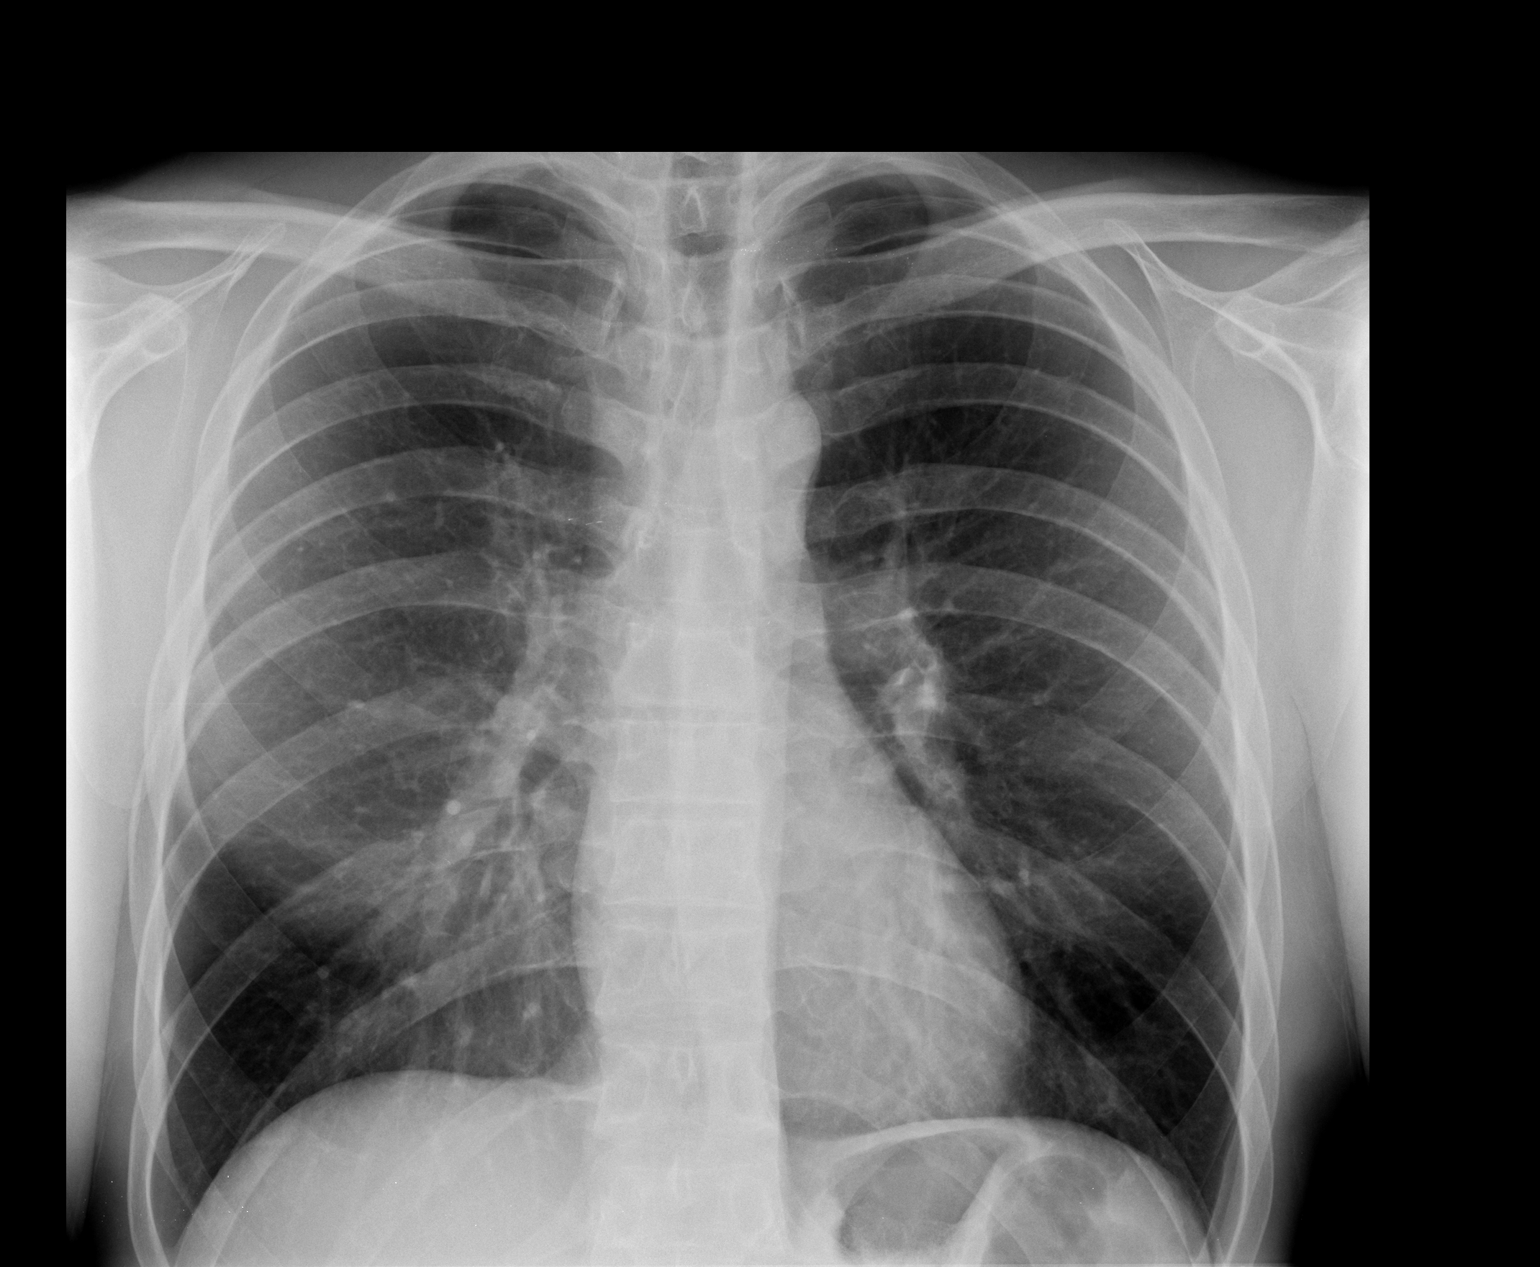

[view not recorded (2 of 2)]
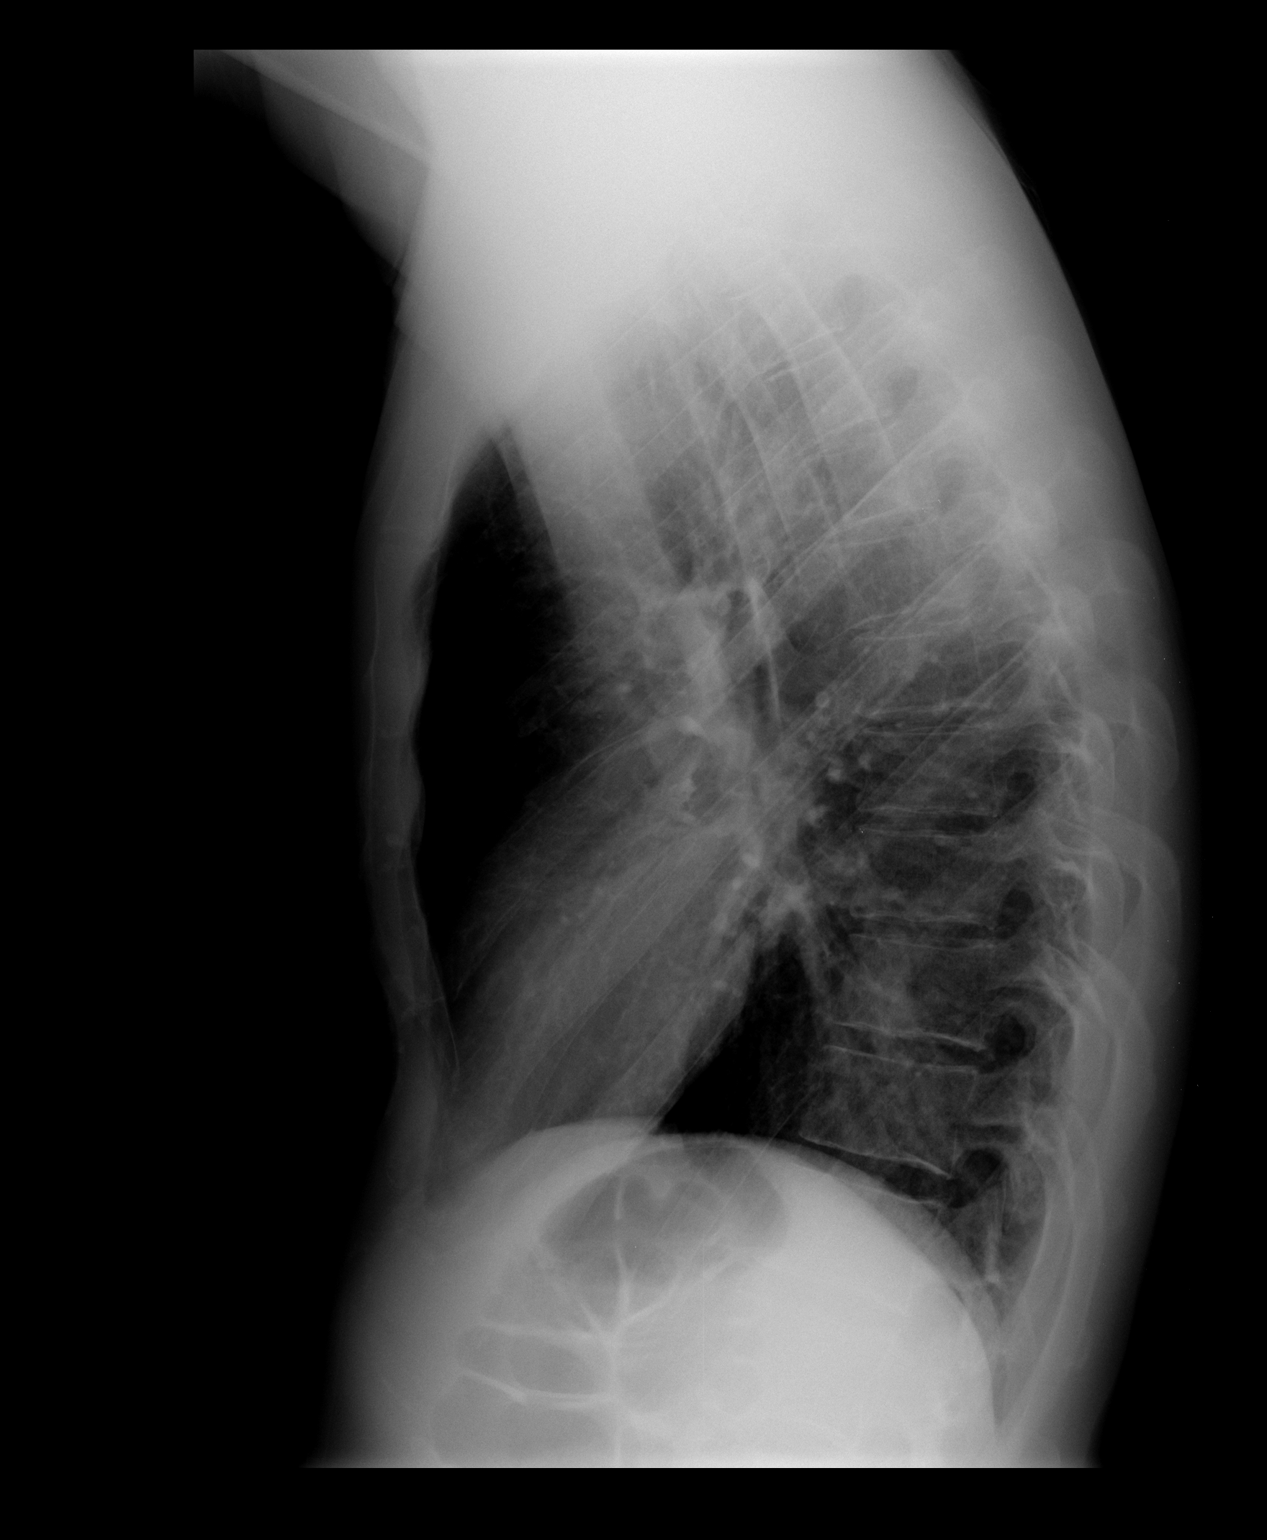

[2 of 2 positions shown; findings below may reference images not displayed]

FINDINGS: Heart size is normal.  Mediastinal shadows are normal.
The lungs are clear.  No infiltrate, mass, effusion or collapse.
IMPRESSION: Normal chest.

## 2013-03-24 IMAGING — RF DG LUMBAR SPINE 2-3V
1 series · 3 of 3 positions shown · non-contrast
Comparison: 08/10/2011.

Fluoroscopy time of 3.2 minutes was utilized.

CLINICAL DATA: 35-year-old male status post lumbar surgery.

LUMBAR SPINE - 2-3 VIEW

[Series 1: run · 3 of 3 slices shown]
[im 1/3]
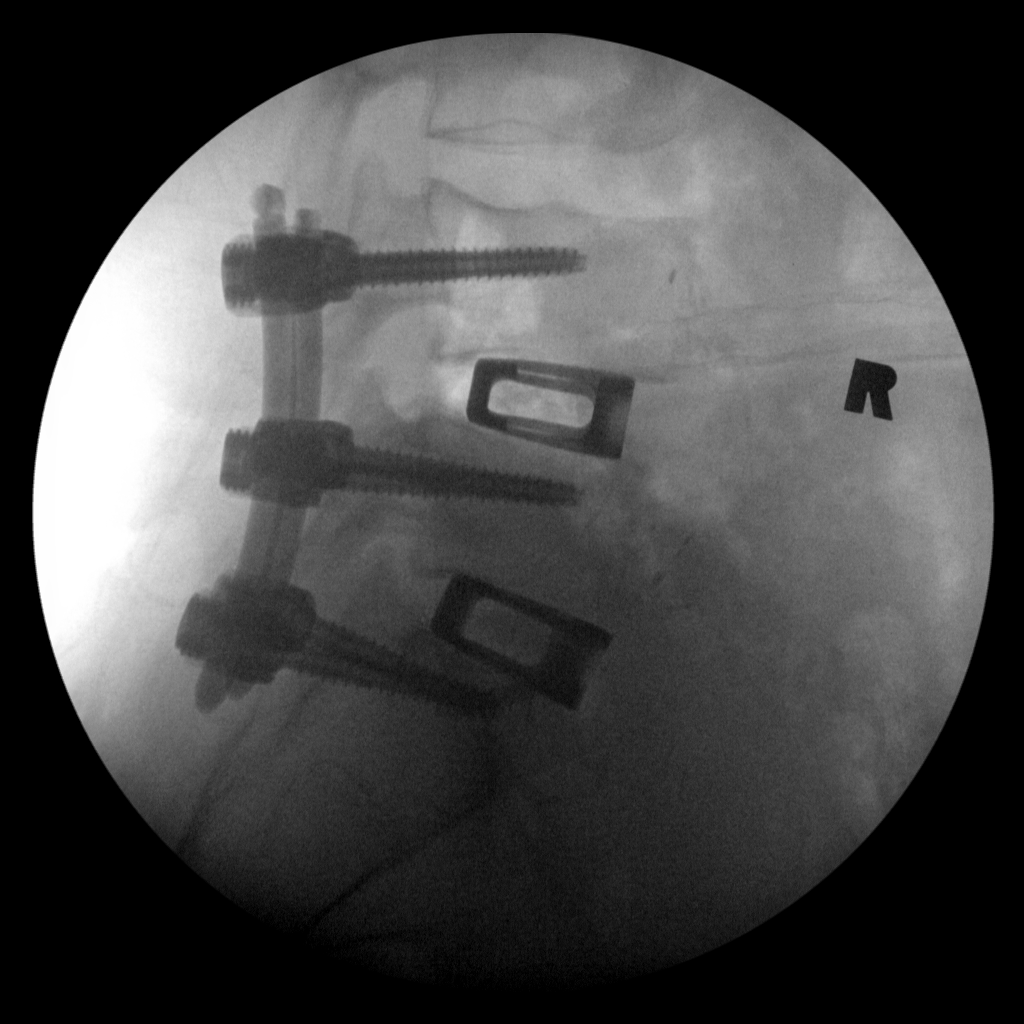
[im 2/3]
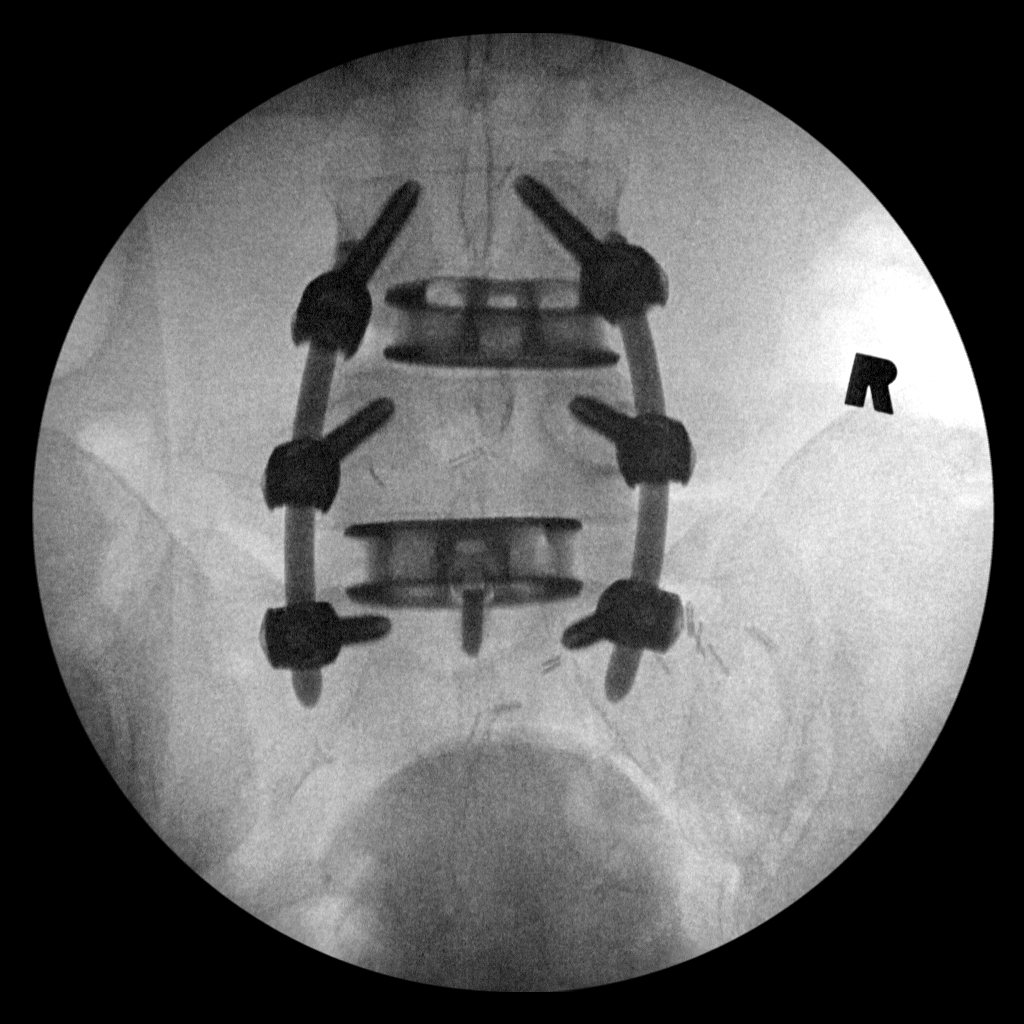
[im 3/3]
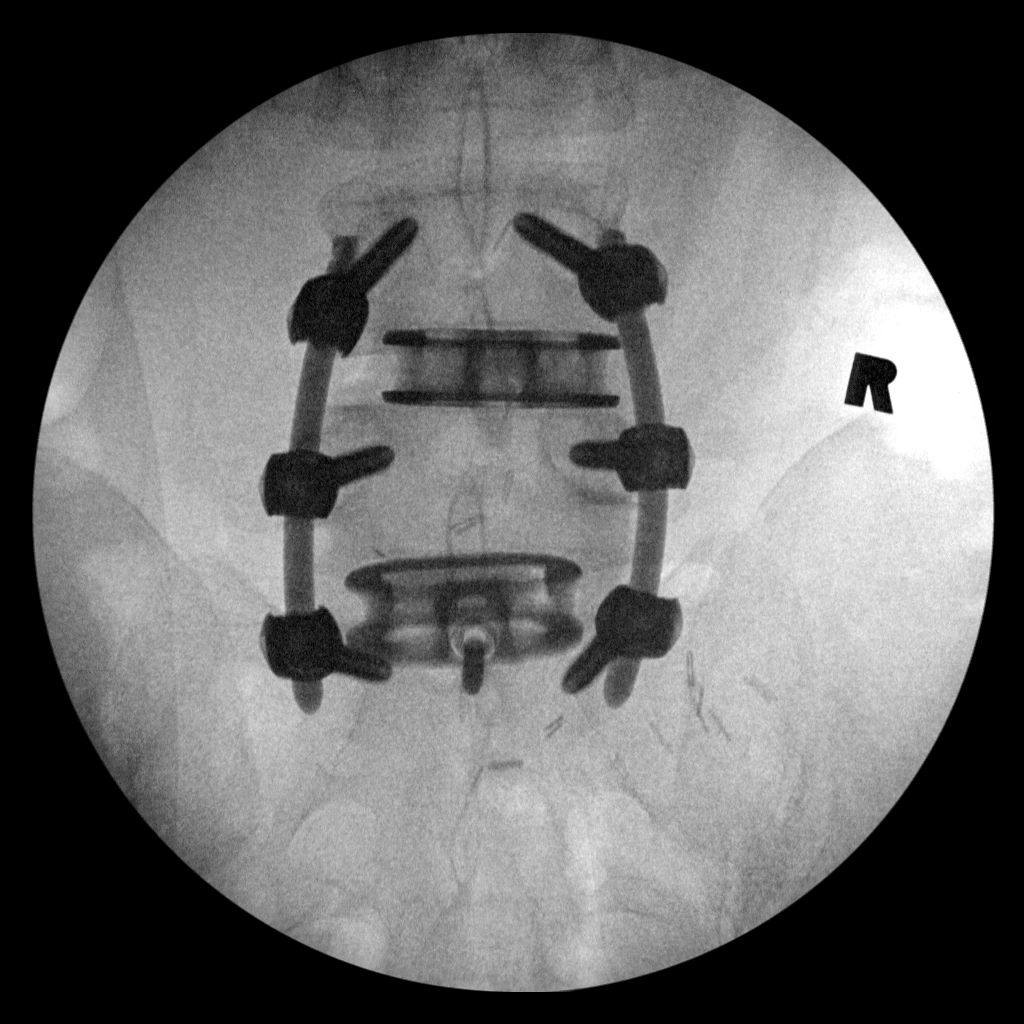

[3 of 3 positions shown; findings below may reference images not displayed]

FINDINGS: Three intraoperative fluoroscopic views of the lumbar
spine.  Transpedicular hardware placed at L4, L5, and S1.
Interbody implant placed at L4-L5 and L5-S1.  Hardware appears
intact.
IMPRESSION: L4-L5 and L5-S1 posterior and interbody fusion.

## 2014-05-02 IMAGING — CR DG CHEST 2V
2 series · 2 of 2 positions shown · non-contrast
Comparison: August 10, 2011

CLINICAL DATA: Hypertension; preoperative for spinal cord
stimulator

CHEST - 2 VIEW

[view not recorded (1 of 2)]
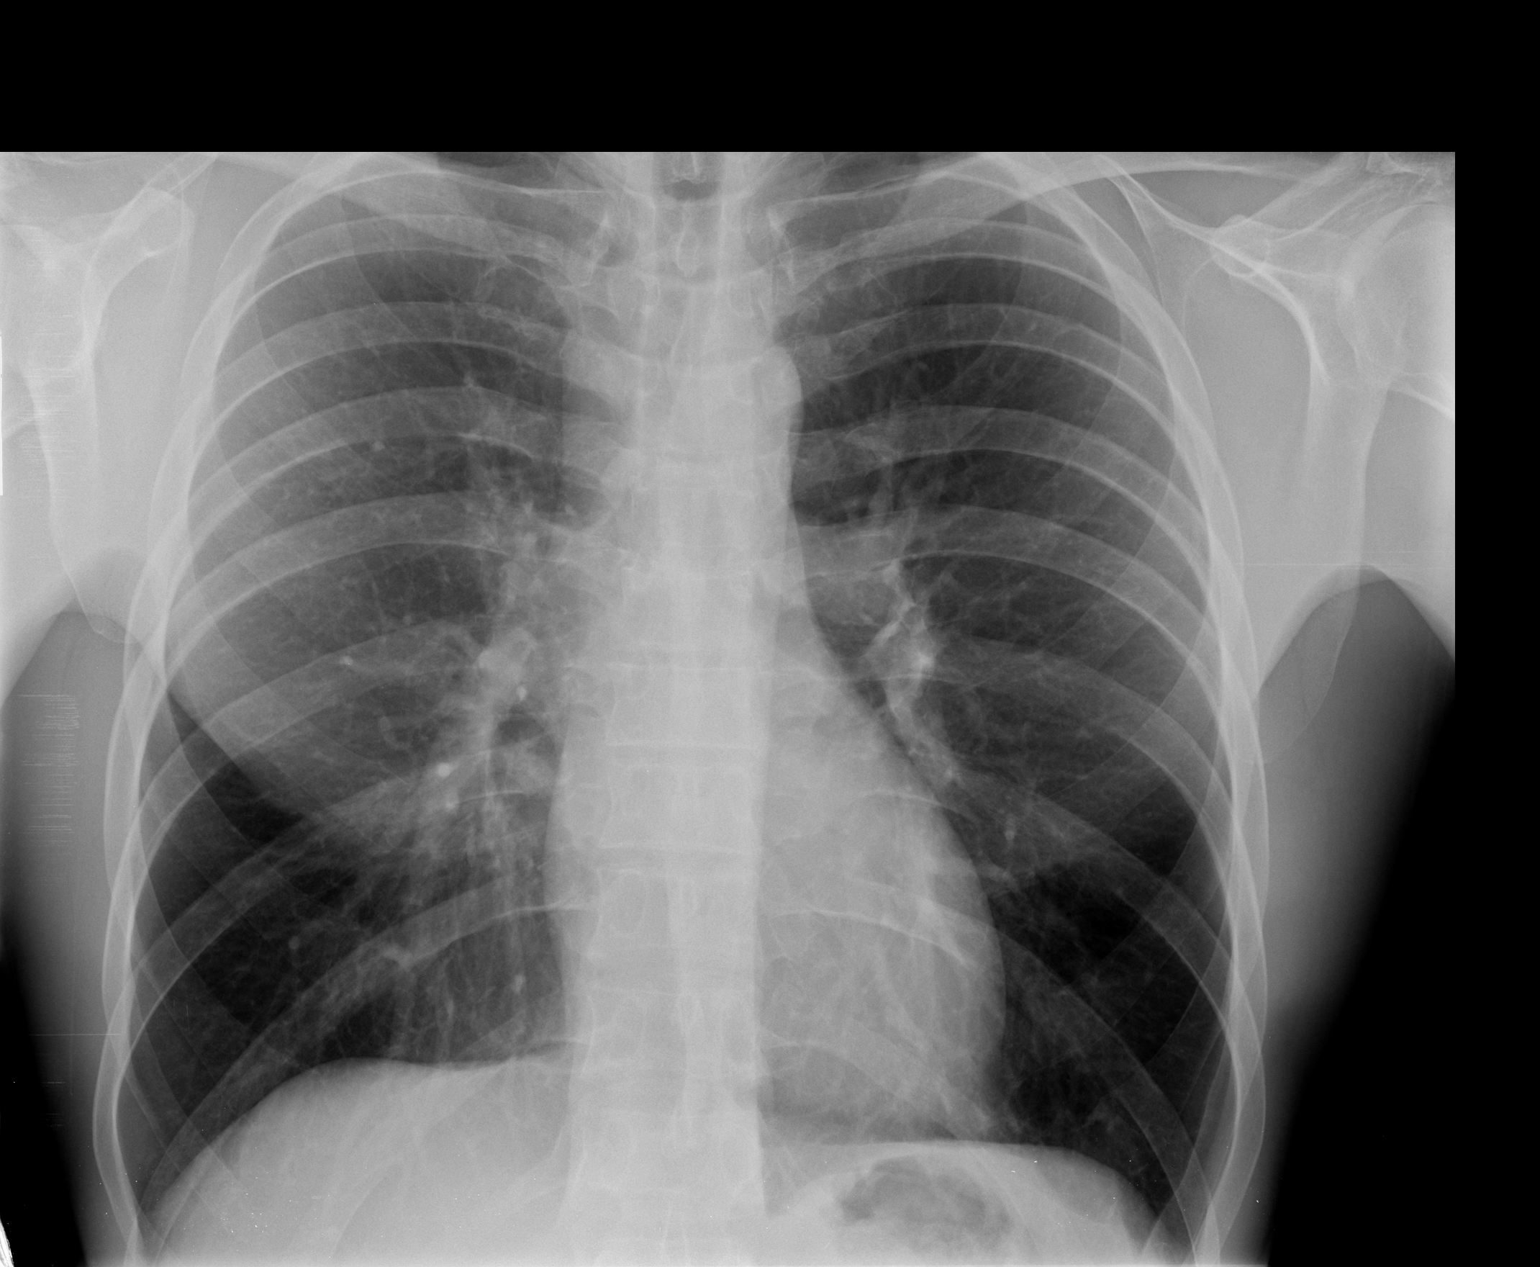

[view not recorded (2 of 2)]
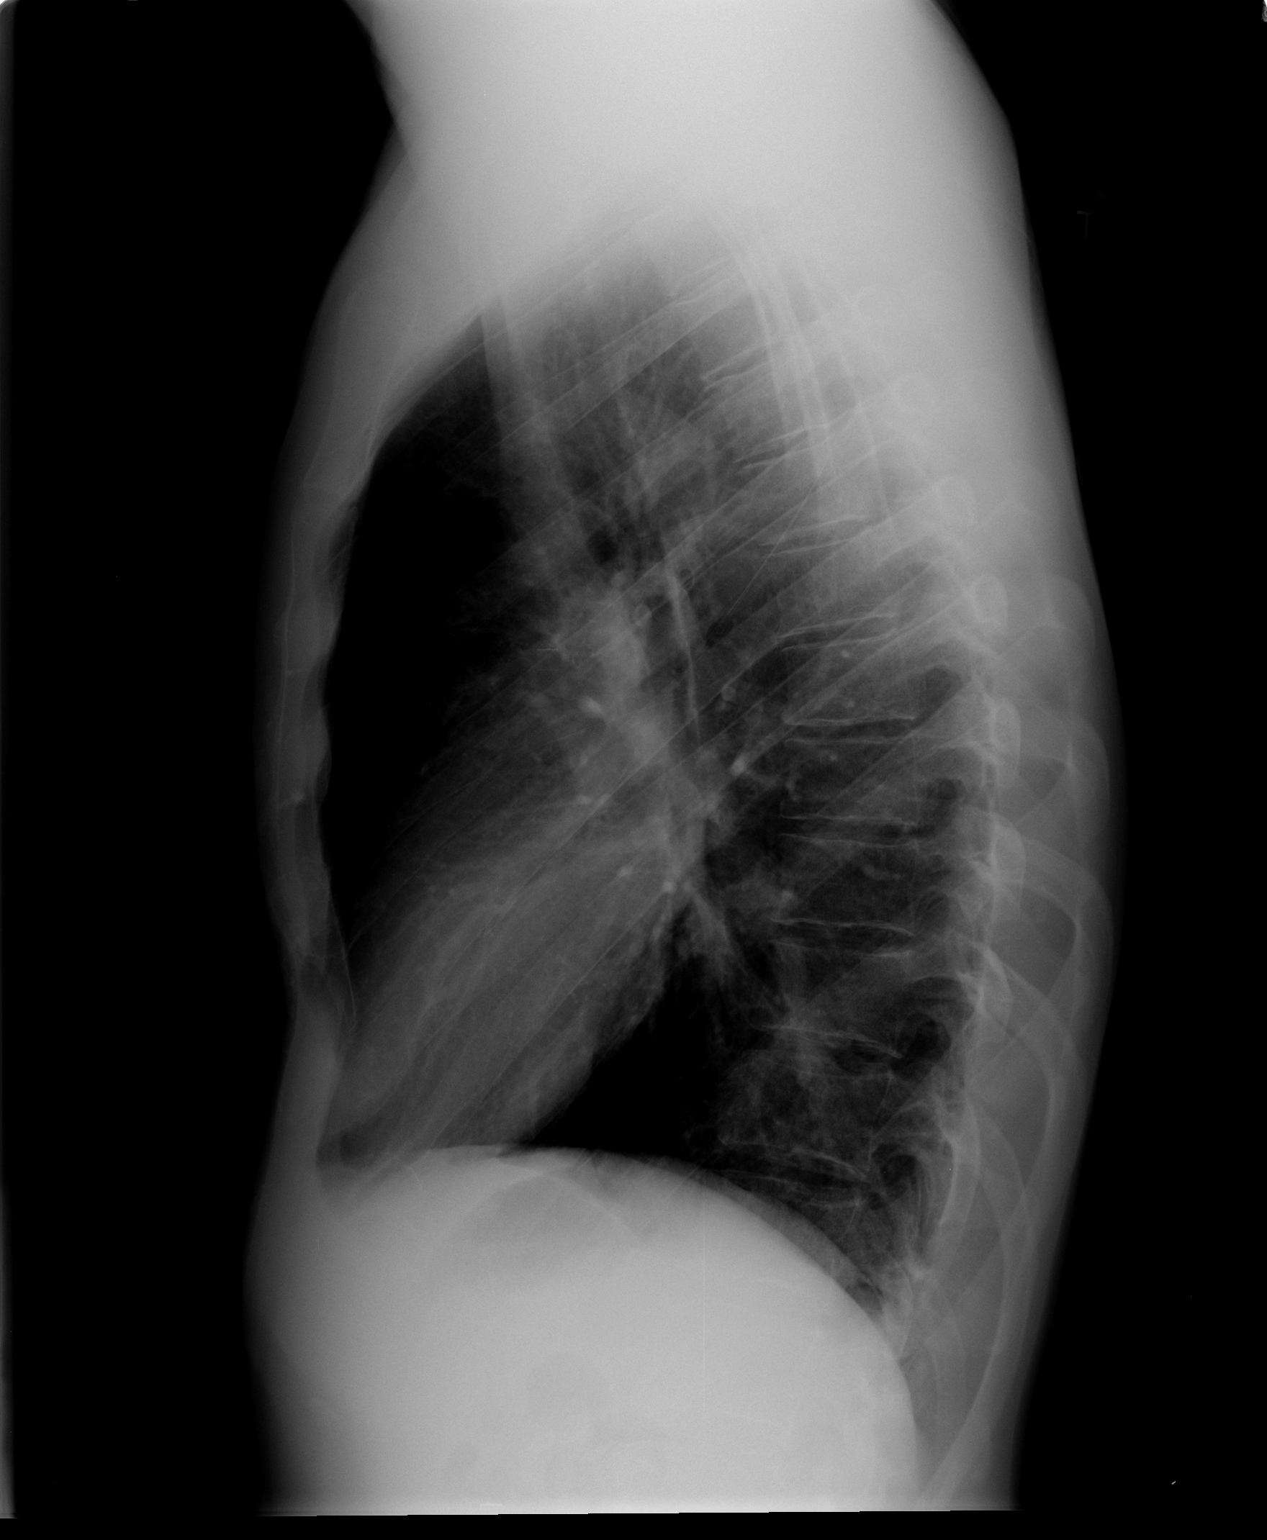

[2 of 2 positions shown; findings below may reference images not displayed]

FINDINGS: There is a degree of underlying emphysema.  Lungs are
clear.  Heart size and pulmonary vascularity are normal.  No
adenopathy. No bone lesions.
IMPRESSION: No edema or consolidation.  Evidence of a degree of underlying
emphysematous change.

## 2014-05-04 IMAGING — RF DG C-ARM 61-120 MIN
1 series · 2 of 2 positions shown · non-contrast
Comparison: Chest x-ray of 09/20/2012

CLINICAL DATA: Insertion of the spinal cord stimulator

DG C-ARM 61-120 MIN

[Series 1: run · 2 of 2 slices shown]
[im 1/2]
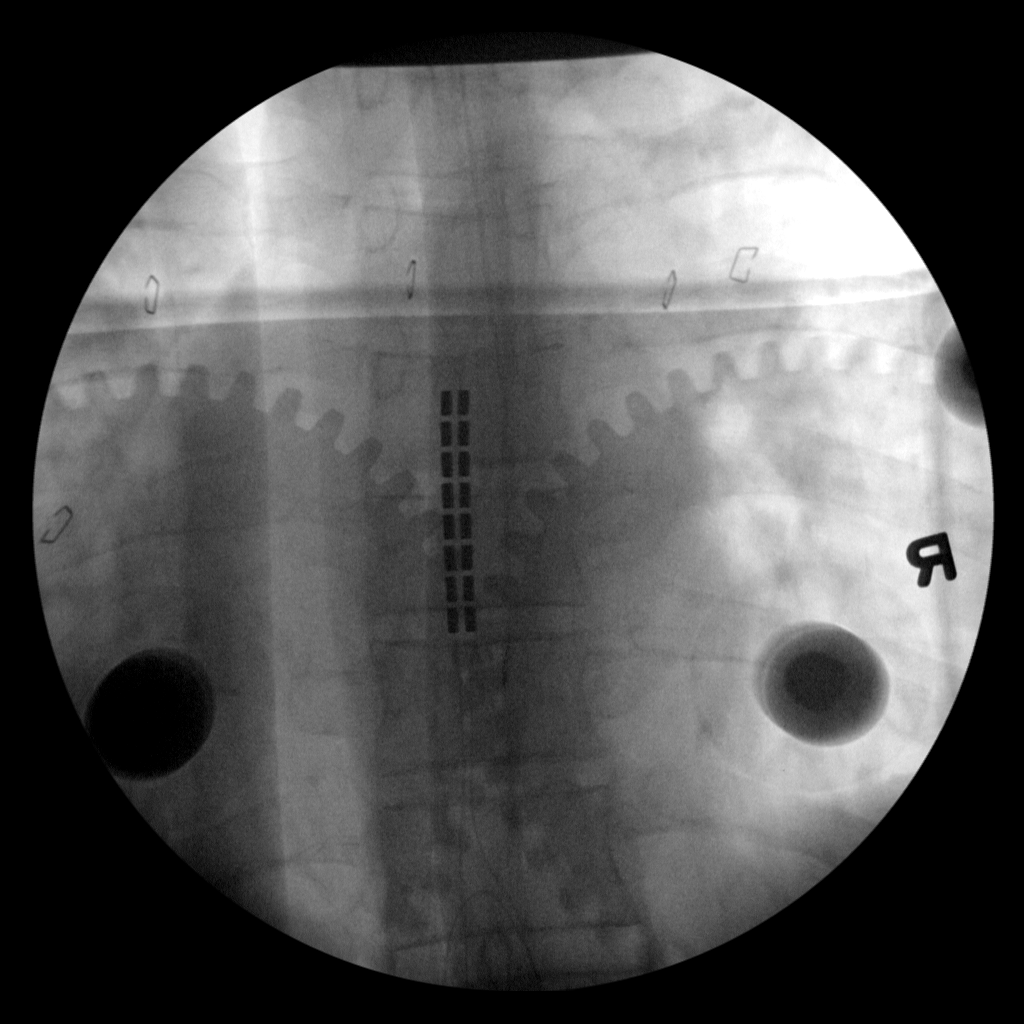
[im 2/2]
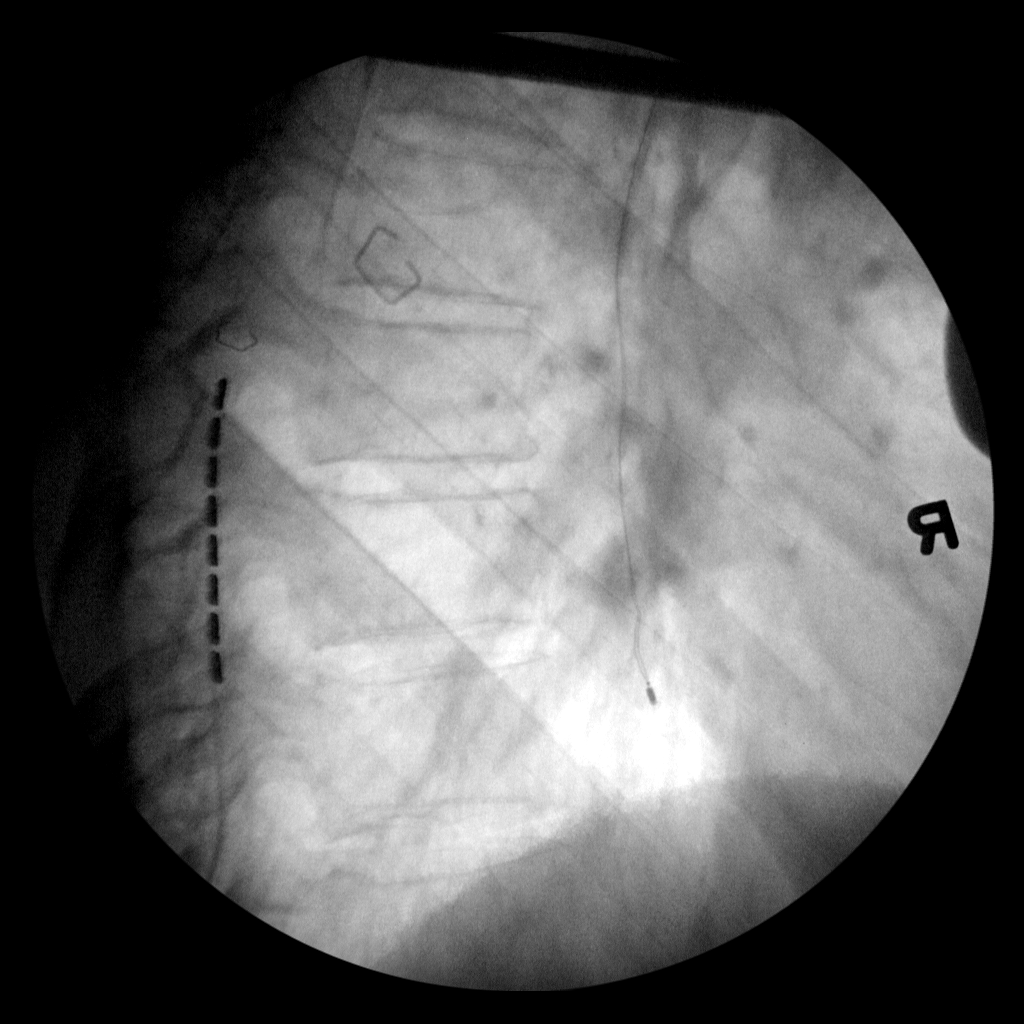

[2 of 2 positions shown; findings below may reference images not displayed]

FINDINGS: Two C-arm spot films were returned.  These images
demonstrate placement of the neurostimulator leads into the region
of the mid thoracic spinal canal.
IMPRESSION: Placement of spinal cord stimulator leads.

## 2017-02-05 ENCOUNTER — Ambulatory Visit: Payer: Self-pay | Admitting: Physician Assistant

## 2017-02-14 ENCOUNTER — Emergency Department
Admission: EM | Admit: 2017-02-14 | Discharge: 2017-02-14 | Disposition: A | Discharge status: Home / Self Care | Payer: Medicaid - Out of State | Attending: Emergency Medicine | Admitting: Emergency Medicine

## 2017-02-14 ENCOUNTER — Emergency Department: Payer: Medicaid - Out of State

## 2017-02-14 ENCOUNTER — Encounter: Payer: Self-pay | Admitting: Emergency Medicine

## 2017-02-14 DIAGNOSIS — I10 Essential (primary) hypertension: Secondary | ICD-10-CM | POA: Insufficient documentation

## 2017-02-14 DIAGNOSIS — W298XXA Contact with other powered powered hand tools and household machinery, initial encounter: Secondary | ICD-10-CM | POA: Diagnosis not present

## 2017-02-14 DIAGNOSIS — Y999 Unspecified external cause status: Secondary | ICD-10-CM | POA: Diagnosis not present

## 2017-02-14 DIAGNOSIS — S61214A Laceration without foreign body of right ring finger without damage to nail, initial encounter: Secondary | ICD-10-CM | POA: Insufficient documentation

## 2017-02-14 DIAGNOSIS — Z7982 Long term (current) use of aspirin: Secondary | ICD-10-CM | POA: Insufficient documentation

## 2017-02-14 DIAGNOSIS — Y93H2 Activity, gardening and landscaping: Secondary | ICD-10-CM | POA: Diagnosis not present

## 2017-02-14 DIAGNOSIS — F17200 Nicotine dependence, unspecified, uncomplicated: Secondary | ICD-10-CM | POA: Insufficient documentation

## 2017-02-14 DIAGNOSIS — Y929 Unspecified place or not applicable: Secondary | ICD-10-CM | POA: Insufficient documentation

## 2017-02-14 DIAGNOSIS — Z79899 Other long term (current) drug therapy: Secondary | ICD-10-CM | POA: Diagnosis not present

## 2017-02-14 MED ORDER — LIDOCAINE HCL (PF) 1 % IJ SOLN
5.0000 mL | Freq: Once | INTRAMUSCULAR | Status: DC
  Filled 2017-02-14: qty 5

## 2017-02-14 MED ORDER — CLINDAMYCIN HCL 300 MG PO CAPS: 300.0000 mg | ORAL_CAPSULE | Freq: Three times a day (TID) | ORAL | 0 refills | Status: AC

## 2017-02-14 NOTE — ED Provider Notes (Signed)
[facility] Emergency Department Provider Note  ____________________________________________  Time seen: Approximately 1:17 PM  I have reviewed the triage vital signs and the nursing notes.   HISTORY  Chief Complaint Extremity Laceration    HPI [name] is a 41 y.o. male that presents to emergency department with left finger injury. Patient got his finger caught in a Counsellor. Last tetanus shot was 2-3 years ago. No additional injuries. He denies tingling.   Past Medical History:  Diagnosis Date  . Anginal pain (HCC)    LAST PAIN 08/2012 TO SEE CARDIOLOGIST  . Arthritis    failed back syndrome   . GERD (gastroesophageal reflux disease)   . Hypertension   . Spine injury 04-14-2010   Workmen's comp injury date    Patient Active Problem List   Diagnosis Date Noted  . Spinal stenosis, lumbar region, without neurogenic claudication 08/05/2011    Past Surgical History:  Procedure Laterality Date  . ANTERIOR AND POSTERIOR SPINAL FUSION  08/13/2011   Procedure: ANTERIOR AND POSTERIOR SPINAL FUSION;  Surgeon: Venita Lick, MD;  Location: [facility] OR;  Service: Orthopedics;  Laterality: N/A;  ALIF L4-S1 POSTERIOR SPINAL FUSION INTERBODY L4-S1 REVISION DECOMPRESSION  . BACK SURGERY     08/2010 & 11/06/2010  . LUMBAR FUSION     2013  . SPINAL CORD STIMULATOR INSERTION N/A 09/22/2012   Procedure: LUMBAR SPINAL CORD STIMULATOR INSERTION;  Surgeon: Venita Lick, MD;  Location: [facility] OR;  Service: Orthopedics;  Laterality: N/A;  . SPINE SURGERY      Prior to Admission medications   Medication Sig Start Date End Date Taking? Authorizing Provider  aspirin EC 81 MG tablet Take 81 mg by mouth daily.    [provider]  clindamycin (CLEOCIN) 300 MG capsule Take 1 capsule (300 mg total) by mouth 3 (three) times daily. 02/14/17 02/24/17  Enid Derry, PA-C  docusate sodium (COLACE) 100 MG capsule Take 1 capsule (100 mg total) by mouth 3 (three) times daily as  needed for constipation. 09/23/12   Venita Lick, MD  isosorbide mononitrate (IMDUR) 30 MG 24 hr tablet Take 30 mg by mouth daily.    [provider]  metoprolol tartrate (LOPRESSOR) 25 MG tablet Take 25 mg by mouth 2 (two) times daily.    [provider]  Multiple Vitamin (MULTIVITAMIN WITH MINERALS) TABS Take 1 tablet by mouth daily.    [provider]  nortriptyline (PAMELOR) 50 MG capsule Take 100 mg by mouth at bedtime.    [provider]  ondansetron (ZOFRAN) 4 MG tablet Take 1 tablet (4 mg total) by mouth every 8 (eight) hours as needed for nausea. 09/23/12   Venita Lick, MD  oxyCODONE-acetaminophen (PERCOCET) 10-325 MG per tablet Take 1 tablet by mouth every 4 (four) hours as needed for pain. 09/23/12   Venita Lick, MD  PARoxetine (PAXIL) 20 MG tablet Take 20 mg by mouth every morning.    [provider]  polyethylene glycol powder (GLYCOLAX) powder Take 17 g by mouth daily. 09/23/12   Venita Lick, MD    Allergies Penicillins  Family History  Problem Relation Age of Onset  . Heart disease Father        Heart Disease before age 38  . Hyperlipidemia Father   . Hypertension Father   . Heart attack Father   . Cancer Sister   . Anesthesia problems Neg Hx   . Hypotension Neg Hx   . Malignant hyperthermia Neg Hx   .  Pseudochol deficiency Neg Hx     Social History Social History  Substance Use Topics  . Smoking status: Current Every Day Smoker    Packs/day: 1.00  . Smokeless tobacco: Never Used  . Alcohol use No     Review of Systems  Cardiovascular: No chest pain. Respiratory:  No SOB. Gastrointestinal: No abdominal pain.   Musculoskeletal: Positive for finger pain. Skin: Negative for rash, ecchymosis. Positive for laceration. Neurological: Negative for headaches, numbness or tingling   ____________________________________________   PHYSICAL EXAM:  VITAL SIGNS: ED Triage Vitals  Enc Vitals Group     BP  02/14/17 1233 128/86     Pulse Rate 02/14/17 1233 100     Resp --      Temp 02/14/17 1233 98.2 F (36.8 C)     Temp Source 02/14/17 1233 Oral     SpO2 02/14/17 1233 100 %     Weight 02/14/17 1232 153 lb (69.4 kg)     Height 02/14/17 1232  (1.803 m)     Head Circumference --      Peak Flow --      Pain Score 02/14/17 1232 8     Pain Loc --      Pain Edu? --      Excl. in GC? --      Constitutional: Alert and oriented. Well appearing and in no acute distress. Eyes: Conjunctivae are normal. PERRL. EOMI. Head: Atraumatic. ENT:      Ears:      Nose: No congestion/rhinnorhea.      Mouth/Throat: Mucous membranes are moist.  Neck: No stridor. Cardiovascular: Normal rate, regular rhythm.  Good peripheral circulation. Respiratory: Normal respiratory effort without tachypnea or retractions. Lungs CTAB. Good air entry to the bases with no decreased or absent breath sounds. Musculoskeletal: Full range of motion to all extremities. No gross deformities appreciated. Neurologic:  Normal speech and language. No gross focal neurologic deficits are appreciated.  Skin:  Skin is warm, dry. 1.5cm well approximated laceration to the tip of the right ring finger. No nail injury. Sensation of tip intact.    ____________________________________________   LABS (all labs ordered are listed, but only abnormal results are displayed)  Labs Reviewed - No data to display ____________________________________________  EKG   ____________________________________________  RADIOLOGY Lexine Baton, personally viewed and evaluated these images (plain radiographs) as part of my medical decision making, as well as reviewing the written report by the radiologist.  Dg Finger Ring Right  Result Date: 02/14/2017 CLINICAL DATA:  Right fourth finger laceration with hedge trimmer. EXAM: RIGHT RING FINGER 2+V COMPARISON:  None. FINDINGS: There is no evidence of fracture or dislocation. There is no evidence  of arthropathy or other focal bone abnormality. Laceration is seen involving distal soft tissues of the finger. IMPRESSION: No fracture or dislocation is noted. Laceration is seen involving distal soft tissues of fourth finger. Electronically Signed   By: Lupita Raider, M.D.   On: 02/14/2017 13:39    ____________________________________________    PROCEDURES  Procedure(s) performed:    Procedures  LACERATION REPAIR Performed by: Enid Derry  Consent: Verbal consent obtained.  Consent given by: patient  Prepped and Draped in normal sterile fashion  Wound explored: No foreign bodies   Laceration Location: distal right ring finger  Laceration Length: 1.5 cm  Anesthesia: None  Local anesthetic: lidocaine 1% without epinephrine  Anesthetic total: 4 ml  Irrigation method: syringe  Amount of cleaning: normal saline  Skin closure:  4-0 and 5-0 nylon  Number of sutures: 3 4-0 and 2 5-0  Technique: Simple interrupted  Patient tolerance: Patient tolerated the procedure well with no immediate complications.  Medications  lidocaine (PF) (XYLOCAINE) 1 % injection 5 mL (not administered)     ____________________________________________   INITIAL IMPRESSION / ASSESSMENT AND PLAN / ED COURSE  Pertinent labs & imaging results that were available during my care of the patient were reviewed by me and considered in my medical decision making (see chart for details).  Review of the [place] CSRS was performed in accordance of the [facility] prior to dispensing any controlled drugs.     Patient's diagnosis is consistent with finger laceration. Vital signs and exam are reassuring. X-ray negative for acute bony abnormalities. Laceration was repaired with stitches. Splint was placed. Tetanus shot is up-to-date. Patient will be discharged home with prescriptions for Keflex. Patient is to follow up with PCP as directed. Patient is given ED precautions to return to the ED for any  worsening or new symptoms.     ____________________________________________  FINAL CLINICAL IMPRESSION(S) / ED DIAGNOSES  Final diagnoses:  Laceration of right ring finger without foreign body without damage to nail, initial encounter      NEW MEDICATIONS STARTED DURING THIS VISIT:  Discharge Medication List as of 02/14/2017  2:55 PM          This chart was dictated using voice recognition software/Dragon. Despite best efforts to proofread, errors can occur which can change the meaning. Any change was purely unintentional.    Enid DerryWagner, Tacara Hadlock, PA-C 02/14/17 Lelon [id number]    Quale, Mark, MD 02/20/17 2157

## 2017-02-14 NOTE — ED Triage Notes (Addendum)
Patient presents to the ED with a laceration to his fourth finger on his right hand.  Patient states he was using hedge trimmers and they came down on his finger.  Edges of cut are clean and aligned.

## 2017-02-15 ENCOUNTER — Ambulatory Visit: Payer: Self-pay | Admitting: Physician Assistant

## 2017-03-10 ENCOUNTER — Other Ambulatory Visit (HOSPITAL_COMMUNITY): Payer: Self-pay | Admitting: *Deleted

## 2017-03-10 ENCOUNTER — Encounter (HOSPITAL_COMMUNITY)
Admission: RE | Admit: 2017-03-10 | Discharge: 2017-03-10 | Disposition: A | Discharge status: Home / Self Care | Payer: Medicaid - Out of State | Source: Ambulatory Visit | Attending: Orthopedic Surgery | Admitting: Orthopedic Surgery

## 2017-03-10 ENCOUNTER — Encounter (HOSPITAL_COMMUNITY): Payer: Self-pay

## 2017-03-10 DIAGNOSIS — Z01818 Encounter for other preprocedural examination: Secondary | ICD-10-CM | POA: Diagnosis not present

## 2017-03-10 DIAGNOSIS — I10 Essential (primary) hypertension: Secondary | ICD-10-CM | POA: Diagnosis not present

## 2017-03-10 HISTORY — DX: Anxiety disorder, unspecified: F41.9

## 2017-03-10 LAB — BASIC METABOLIC PANEL
Anion gap: 7 (ref 5–15)
BUN: 11 mg/dL (ref 6–20)
CALCIUM: 9.1 mg/dL (ref 8.9–10.3)
CHLORIDE: 103 mmol/L (ref 101–111)
CO2: 28 mmol/L (ref 22–32)
CREATININE: 0.92 mg/dL (ref 0.61–1.24)
GFR calc non Af Amer: >60 mL/min (ref >60)
Glucose, Bld: 55 mg/dL — ABNORMAL LOW (ref 65–99)
Potassium: 3.5 mmol/L (ref 3.5–5.1)
SODIUM: 138 mmol/L (ref 135–145)

## 2017-03-10 LAB — CBC
HCT: 40 % (ref 39.0–52.0)
Hemoglobin: 14.2 g/dL (ref 13.0–17.0)
MCH: 30.2 pg (ref 26.0–34.0)
MCHC: 35.5 g/dL (ref 30.0–36.0)
MCV: 85.1 fL (ref 78.0–100.0)
PLATELETS: 304 10*3/uL (ref 150–400)
RBC: 4.7 MIL/uL (ref 4.22–5.81)
RDW: 13.3 % (ref 11.5–15.5)
WBC: 8.2 10*3/uL (ref 4.0–10.5)

## 2017-03-10 LAB — SURGICAL PCR SCREEN
MRSA, PCR: NEGATIVE
Staphylococcus aureus: NEGATIVE

## 2017-03-10 NOTE — Pre-Procedure Instructions (Addendum)
ERIKSON DANZY  03/10/2017    Your procedure is scheduled on Wednesday, March 17, 2017 at 8:30 AM.   Report to [facility] Entrance "A" Admitting Office at 6:30 AM.   Call this number if you have problems the morning of surgery: [phone]   Questions prior to day of surgery, please call [phone] between 8 & 4 PM.   Remember:  Do not eat food or drink liquids after midnight Tuesday, 03/16/17.  Take these medicines the morning of surgery with A SIP OF WATER: Tramadol    Do not use NSAIDS (Ibuprofen, Aleve, etc) 5 days prior to surgery.  Do NOT smoke 24 hours prior to surgery.   Do not wear jewelry.  Do not wear lotions, powders, cologne or deodorant.  Men may shave face and neck.  Do not bring valuables to the hospital.  [facility] is not responsible for any belongings or valuables.  Contacts, dentures or bridgework may not be worn into surgery.  Leave your suitcase in the car.  After surgery it may be brought to your room.  For patients admitted to the hospital, discharge time will be determined by your treatment team.  Patients discharged the day of surgery will not be allowed to drive home.    - Preparing for Surgery  Before surgery, you can play an important role.  Because skin is not sterile, your skin needs to be as free of germs as possible.  You can reduce the number of germs on you skin by washing with CHG (chlorahexidine gluconate) soap before surgery.  CHG is an antiseptic cleaner which kills germs and bonds with the skin to continue killing germs even after washing.  Please DO NOT use if you have an allergy to CHG or antibacterial soaps.  If your skin becomes reddened/irritated stop using the CHG and inform your nurse when you arrive at Short Stay.  Do not shave (including legs and underarms) for at least 48 hours prior to the first CHG shower.  You may shave your face.  Please follow these instructions carefully:   1.  Shower with CHG  Soap the night before surgery and the                    morning of Surgery.  2.  If you choose to wash your hair, wash your hair first as usual with your       normal shampoo.  3.  After you shampoo, rinse your hair and body thoroughly to remove the shampoo.  4.  Use CHG as you would any other liquid soap.  You can apply chg directly       to the skin and wash gently with scrungie or a clean washcloth.  5.  Apply the CHG Soap to your body ONLY FROM THE NECK DOWN.        Do not use on open wounds or open sores.  Avoid contact with your eyes, ears, mouth and genitals (private parts).  Wash genitals (private parts) with your normal soap.  6.  Wash thoroughly, paying special attention to the area where your surgery        will be performed.  7.  Thoroughly rinse your body with warm water from the neck down.  8.  DO NOT shower/wash with your normal soap after using and rinsing off       the CHG Soap.  9.  Pat yourself dry with a clean towel.  10.  Wear clean pajamas.            11.  Place clean sheets on your bed the night of your first shower and do not        sleep with pets.  Day of Surgery  Do not apply any lotions/deodorants the morning of surgery.  Please wear clean clothes to the hospital.   Please read over the fact sheets that you were given.

## 2017-03-10 NOTE — Progress Notes (Signed)
Pt denies cardiac history. I asked him about whether he ever followed up with a cardiologist in 2014 for chest pain. He said he did see one in [place] and had a cath done at [facility] in [place], [place] in 2016. He states "it was clean". I have requested copy of cath from the hospital. Pt denies any recent chest pain or sob. Pt states he is not diabetic.

## 2017-03-11 NOTE — Progress Notes (Signed)
Anesthesia Chart Review:  Pt is a 41 year old male scheduled for spinal cord stimulator battery exchange on 03/17/2017 with Venita Lick, MD.   PMH includes:  HTN, GERD. Current smoker. BMI 21. S/p lumbar spinal cord stimulator insertion 09/22/12. S/p spinal fusion 08/13/11.   Medications reviewed.  Preoperative labs reviewed.    EKG 03/10/17: NSR  Cardiac cath 12/05/13 [facility] heart and vascular [place], [place]): 1. Normal coronary arteries. Unusually high takeoffs of the LMCA and RCA. 2. LV wall motion normal. 3. Trivial MV regurgitation. 4. No aortic valve stenosis.  If no changes, I anticipate pt can proceed with surgery as scheduled.    Rica Mast, FNP-BC [facility] Short Stay Surgical Center/Anesthesiology Phone: [phone] 03/11/2017 12:55 PM

## 2017-03-16 ENCOUNTER — Encounter (HOSPITAL_COMMUNITY): Payer: Self-pay | Admitting: Anesthesiology

## 2017-03-16 MED ORDER — VANCOMYCIN HCL IN DEXTROSE 1-5 GM/200ML-% IV SOLN
1000.0000 mg | INTRAVENOUS | Status: AC
  Administered 2017-03-17: 1000 mg via INTRAVENOUS
  Filled 2017-03-16: qty 200

## 2017-03-16 NOTE — Anesthesia Preprocedure Evaluation (Addendum)
Anesthesia Evaluation  Patient identified by MRN, date of birth, ID band Patient awake    Reviewed: Allergy & Precautions, NPO status , Patient's Chart, lab work & pertinent test results  Airway Mallampati: I  TM Distance: >3 FB Neck ROM: Full    Dental  (+) Upper Dentures, Lower Dentures   Pulmonary Current Smoker,  Smoking cessation discussed   Pulmonary exam normal breath sounds clear to auscultation       Cardiovascular  Rhythm:Regular Rate:Normal     Neuro/Psych    GI/Hepatic Neg liver ROS,   Endo/Other  negative endocrine ROS  Renal/GU negative Renal ROS     Musculoskeletal  (+) Arthritis ,   Abdominal Normal abdominal exam  (+)   Peds  Hematology negative hematology ROS (+)   Anesthesia Other Findings   Reproductive/Obstetrics                           Anesthesia Physical Anesthesia Plan  ASA: II  Anesthesia Plan: General   Post-op Pain Management:    Induction: Intravenous  PONV Risk Score and Plan: 2 and Ondansetron and Dexamethasone  Airway Management Planned: Oral ETT  Additional Equipment:   Intra-op Plan:   Post-operative Plan: Extubation in OR  Informed Consent: I have reviewed the patients History and Physical, chart, labs and discussed the procedure including the risks, benefits and alternatives for the proposed anesthesia with the patient or authorized representative who has indicated his/her understanding and acceptance.   Dental advisory given  Plan Discussed with: CRNA and Surgeon  Anesthesia Plan Comments:        Anesthesia Quick Evaluation

## 2017-03-17 ENCOUNTER — Encounter (HOSPITAL_COMMUNITY): Payer: Self-pay | Admitting: Registered Nurse

## 2017-03-17 ENCOUNTER — Encounter (HOSPITAL_COMMUNITY)
Admission: RE | Disposition: A | Discharge status: Home / Self Care | Payer: Self-pay | Source: Ambulatory Visit | Attending: Orthopedic Surgery

## 2017-03-17 ENCOUNTER — Ambulatory Visit (HOSPITAL_COMMUNITY)
Admission: RE | Admit: 2017-03-17 | Discharge: 2017-03-17 | Disposition: A | Discharge status: Home / Self Care | Payer: Medicaid - Out of State | Source: Ambulatory Visit | Attending: Orthopedic Surgery | Admitting: Orthopedic Surgery

## 2017-03-17 ENCOUNTER — Inpatient Hospital Stay (HOSPITAL_COMMUNITY): Payer: Medicaid - Out of State | Admitting: Registered Nurse

## 2017-03-17 ENCOUNTER — Inpatient Hospital Stay (HOSPITAL_COMMUNITY): Payer: Medicaid - Out of State | Admitting: Emergency Medicine

## 2017-03-17 DIAGNOSIS — G894 Chronic pain syndrome: Secondary | ICD-10-CM | POA: Insufficient documentation

## 2017-03-17 DIAGNOSIS — F17210 Nicotine dependence, cigarettes, uncomplicated: Secondary | ICD-10-CM | POA: Insufficient documentation

## 2017-03-17 DIAGNOSIS — Z4542 Encounter for adjustment and management of neuropacemaker (brain) (peripheral nerve) (spinal cord): Secondary | ICD-10-CM | POA: Insufficient documentation

## 2017-03-17 DIAGNOSIS — Z79899 Other long term (current) drug therapy: Secondary | ICD-10-CM | POA: Insufficient documentation

## 2017-03-17 HISTORY — PX: SPINAL CORD STIMULATOR BATTERY EXCHANGE: SHX6202

## 2017-03-17 SURGERY — SPINAL CORD STIMULATOR BATTERY EXCHANGE
Anesthesia: General | Site: Back

## 2017-03-17 MED ORDER — ACETAMINOPHEN 325 MG PO TABS: 325.0000 mg | ORAL_TABLET | ORAL | Status: DC | PRN

## 2017-03-17 MED ORDER — FENTANYL CITRATE (PF) 100 MCG/2ML IJ SOLN
INTRAMUSCULAR | Status: AC
  Filled 2017-03-17: qty 2

## 2017-03-17 MED ORDER — DEXAMETHASONE SODIUM PHOSPHATE 10 MG/ML IJ SOLN
INTRAMUSCULAR | Status: DC | PRN
  Administered 2017-03-17: 10 mg via INTRAVENOUS

## 2017-03-17 MED ORDER — PROPOFOL 10 MG/ML IV BOLUS
INTRAVENOUS | Status: AC
  Filled 2017-03-17: qty 20

## 2017-03-17 MED ORDER — ONDANSETRON HCL 4 MG/2ML IJ SOLN: 4.0000 mg | Freq: Once | INTRAMUSCULAR | Status: DC | PRN

## 2017-03-17 MED ORDER — BUPIVACAINE-EPINEPHRINE (PF) 0.25% -1:200000 IJ SOLN
INTRAMUSCULAR | Status: AC
  Filled 2017-03-17: qty 30

## 2017-03-17 MED ORDER — OXYCODONE HCL 5 MG/5ML PO SOLN: 5.0000 mg | Freq: Once | ORAL | Status: AC | PRN

## 2017-03-17 MED ORDER — KETOROLAC TROMETHAMINE 30 MG/ML IJ SOLN
30.0000 mg | Freq: Once | INTRAMUSCULAR | Status: DC | PRN
  Administered 2017-03-17: 30 mg via INTRAVENOUS

## 2017-03-17 MED ORDER — FENTANYL CITRATE (PF) 250 MCG/5ML IJ SOLN
INTRAMUSCULAR | Status: DC | PRN
  Administered 2017-03-17 (×2): 50 ug via INTRAVENOUS
  Administered 2017-03-17: 100 ug via INTRAVENOUS
  Administered 2017-03-17: 50 ug via INTRAVENOUS

## 2017-03-17 MED ORDER — 0.9 % SODIUM CHLORIDE (POUR BTL) OPTIME
TOPICAL | Status: DC | PRN
  Administered 2017-03-17: 1000 mL

## 2017-03-17 MED ORDER — SUGAMMADEX SODIUM 200 MG/2ML IV SOLN
INTRAVENOUS | Status: AC
  Filled 2017-03-17: qty 2

## 2017-03-17 MED ORDER — MIDAZOLAM HCL 2 MG/2ML IJ SOLN
INTRAMUSCULAR | Status: AC
  Filled 2017-03-17: qty 2

## 2017-03-17 MED ORDER — MIDAZOLAM HCL 5 MG/5ML IJ SOLN
INTRAMUSCULAR | Status: DC | PRN
  Administered 2017-03-17: 2 mg via INTRAVENOUS

## 2017-03-17 MED ORDER — FENTANYL CITRATE (PF) 250 MCG/5ML IJ SOLN
INTRAMUSCULAR | Status: AC
  Filled 2017-03-17: qty 5

## 2017-03-17 MED ORDER — FENTANYL CITRATE (PF) 100 MCG/2ML IJ SOLN
25.0000 ug | INTRAMUSCULAR | Status: DC | PRN
  Administered 2017-03-17: 50 ug via INTRAVENOUS

## 2017-03-17 MED ORDER — SUGAMMADEX SODIUM 200 MG/2ML IV SOLN
INTRAVENOUS | Status: DC | PRN
  Administered 2017-03-17: 200 mg via INTRAVENOUS

## 2017-03-17 MED ORDER — METHOCARBAMOL 500 MG PO TABS: 500.0000 mg | ORAL_TABLET | Freq: Three times a day (TID) | ORAL | 0 refills | Status: AC | PRN

## 2017-03-17 MED ORDER — MEPERIDINE HCL 25 MG/ML IJ SOLN: 6.2500 mg | INTRAMUSCULAR | Status: DC | PRN

## 2017-03-17 MED ORDER — LIDOCAINE 2% (20 MG/ML) 5 ML SYRINGE
INTRAMUSCULAR | Status: DC | PRN
  Administered 2017-03-17: 80 mg via INTRAVENOUS

## 2017-03-17 MED ORDER — ROCURONIUM BROMIDE 10 MG/ML (PF) SYRINGE
PREFILLED_SYRINGE | INTRAVENOUS | Status: DC | PRN
  Administered 2017-03-17: 40 mg via INTRAVENOUS

## 2017-03-17 MED ORDER — ONDANSETRON HCL 4 MG/2ML IJ SOLN
INTRAMUSCULAR | Status: DC | PRN
Start: 2017-03-17 — End: 2017-03-17
  Administered 2017-03-17: 4 mg via INTRAVENOUS

## 2017-03-17 MED ORDER — ONDANSETRON HCL 4 MG PO TABS: 4.0000 mg | ORAL_TABLET | Freq: Three times a day (TID) | ORAL | 0 refills | Status: AC | PRN

## 2017-03-17 MED ORDER — ROCURONIUM BROMIDE 10 MG/ML (PF) SYRINGE
PREFILLED_SYRINGE | INTRAVENOUS | Status: AC
  Filled 2017-03-17: qty 5

## 2017-03-17 MED ORDER — BUPIVACAINE LIPOSOME 1.3 % IJ SUSP
20.0000 mL | Freq: Once | INTRAMUSCULAR | Status: DC
  Filled 2017-03-17: qty 20

## 2017-03-17 MED ORDER — DEXAMETHASONE SODIUM PHOSPHATE 10 MG/ML IJ SOLN
INTRAMUSCULAR | Status: AC
  Filled 2017-03-17: qty 1

## 2017-03-17 MED ORDER — OXYCODONE HCL 5 MG PO TABS
ORAL_TABLET | ORAL | Status: AC
  Filled 2017-03-17: qty 1

## 2017-03-17 MED ORDER — ACETAMINOPHEN 160 MG/5ML PO SOLN: 325.0000 mg | ORAL | Status: DC | PRN

## 2017-03-17 MED ORDER — ONDANSETRON HCL 4 MG/2ML IJ SOLN
INTRAMUSCULAR | Status: AC
  Filled 2017-03-17: qty 2

## 2017-03-17 MED ORDER — OXYCODONE HCL 5 MG PO TABS
5.0000 mg | ORAL_TABLET | Freq: Once | ORAL | Status: AC | PRN
  Administered 2017-03-17: 5 mg via ORAL

## 2017-03-17 MED ORDER — PROPOFOL 10 MG/ML IV BOLUS
INTRAVENOUS | Status: DC | PRN
  Administered 2017-03-17: 150 mg via INTRAVENOUS

## 2017-03-17 MED ORDER — LACTATED RINGERS IV SOLN
INTRAVENOUS | Status: DC | PRN
  Administered 2017-03-17 (×2): via INTRAVENOUS

## 2017-03-17 MED ORDER — OXYCODONE-ACETAMINOPHEN 10-325 MG PO TABS: 1.0000 | ORAL_TABLET | Freq: Four times a day (QID) | ORAL | 0 refills | Status: AC | PRN

## 2017-03-17 MED ORDER — KETOROLAC TROMETHAMINE 30 MG/ML IJ SOLN
INTRAMUSCULAR | Status: AC
  Filled 2017-03-17: qty 1

## 2017-03-17 MED ORDER — LIDOCAINE 2% (20 MG/ML) 5 ML SYRINGE
INTRAMUSCULAR | Status: AC
  Filled 2017-03-17: qty 5

## 2017-03-17 SURGICAL SUPPLY — 57 items
CANISTER SUCT 3000ML PPV (MISCELLANEOUS) ×3 IMPLANT
CLOSURE WOUND 1/2 X4 (GAUZE/BANDAGES/DRESSINGS) ×1
CORDS BIPOLAR (ELECTRODE) ×3 IMPLANT
DEVICE IMPLANT NEUROSTIMINATOR (Neuro Prosthesis/Implant) ×1 IMPLANT
DEVICE IMPLANT NEUROSTIMULATOR (Neuro Prosthesis/Implant) ×2 IMPLANT
DRAPE INCISE IOBAN 66X45 STRL (DRAPES) ×3 IMPLANT
DRAPE LAPAROTOMY 100X72 PEDS (DRAPES) ×3 IMPLANT
DRAPE PED LAPAROTOMY (DRAPES) ×3 IMPLANT
DRAPE POUCH INSTRU U-SHP 10X18 (DRAPES) ×3 IMPLANT
DRAPE SURG 17X23 STRL (DRAPES) ×3 IMPLANT
DRAPE U-SHAPE 47X51 STRL (DRAPES) ×3 IMPLANT
DRSG AQUACEL AG ADV 3.5X 6 (GAUZE/BANDAGES/DRESSINGS) ×3 IMPLANT
DRSG OPSITE POSTOP 4X8 (GAUZE/BANDAGES/DRESSINGS) ×3 IMPLANT
DURAPREP 26ML APPLICATOR (WOUND CARE) ×3 IMPLANT
ELECT CAUTERY BLADE 6.4 (BLADE) ×3 IMPLANT
ELECT PENCIL ROCKER SW 15FT (MISCELLANEOUS) ×3 IMPLANT
ELECT REM PT RETURN 9FT ADLT (ELECTROSURGICAL) ×3
ELECTRODE REM PT RTRN 9FT ADLT (ELECTROSURGICAL) ×1 IMPLANT
ENVELOPE ABSORB ANTIBACTERIAL (Neuro Prosthesis/Implant) ×3 IMPLANT
GLOVE BIO SURGEON STRL SZ 6.5 (GLOVE) ×2 IMPLANT
GLOVE BIO SURGEONS STRL SZ 6.5 (GLOVE) ×1
GLOVE BIOGEL PI IND STRL 6.5 (GLOVE) ×1 IMPLANT
GLOVE BIOGEL PI IND STRL 8.5 (GLOVE) ×1 IMPLANT
GLOVE BIOGEL PI INDICATOR 6.5 (GLOVE) ×2
GLOVE BIOGEL PI INDICATOR 8.5 (GLOVE) ×2
GLOVE SS BIOGEL STRL SZ 8.5 (GLOVE) ×2 IMPLANT
GLOVE SUPERSENSE BIOGEL SZ 8.5 (GLOVE) ×4
GOWN STRL REUS W/ TWL LRG LVL3 (GOWN DISPOSABLE) ×2 IMPLANT
GOWN STRL REUS W/TWL 2XL LVL3 (GOWN DISPOSABLE) ×3 IMPLANT
GOWN STRL REUS W/TWL LRG LVL3 (GOWN DISPOSABLE) ×4
KIT BASIN OR (CUSTOM PROCEDURE TRAY) ×3 IMPLANT
KIT NEURO ACCESSORY W/WRENCH (MISCELLANEOUS) ×3 IMPLANT
KIT ROOM TURNOVER OR (KITS) ×3 IMPLANT
NDL SUT 6 .5 CRC .975X.05 MAYO (NEEDLE) ×1 IMPLANT
NEEDLE 22X1 1/2 (OR ONLY) (NEEDLE) IMPLANT
NEEDLE MAYO TAPER (NEEDLE) ×2
NS IRRIG 1000ML POUR BTL (IV SOLUTION) ×3 IMPLANT
PACK GENERAL/GYN (CUSTOM PROCEDURE TRAY) ×3 IMPLANT
PACK UNIVERSAL I (CUSTOM PROCEDURE TRAY) ×3 IMPLANT
PAD ARMBOARD 7.5X6 YLW CONV (MISCELLANEOUS) ×6 IMPLANT
RECHARGER INTELLIS (NEUROSURGERY SUPPLIES) ×3 IMPLANT
REPROGRAMMER INTELLIS (NEUROSURGERY SUPPLIES) ×3 IMPLANT
SPONGE LAP 4X18 X RAY DECT (DISPOSABLE) ×3 IMPLANT
STAPLER VISISTAT 35W (STAPLE) IMPLANT
STIMULATOR WIRELESS 74X79X20MM (MISCELLANEOUS) ×3 IMPLANT
STRIP CLOSURE SKIN 1/2X4 (GAUZE/BANDAGES/DRESSINGS) ×2 IMPLANT
SUT ETHIBOND NAB CT1 #1 30IN (SUTURE) ×6 IMPLANT
SUT MON AB 3-0 SH 27 (SUTURE) ×2
SUT MON AB 3-0 SH27 (SUTURE) ×1 IMPLANT
SUT VIC AB 1 CT1 18XCR BRD 8 (SUTURE) ×1 IMPLANT
SUT VIC AB 1 CT1 8-18 (SUTURE) ×2
SUT VIC AB 2-0 CT1 18 (SUTURE) IMPLANT
SYR BULB IRRIGATION 50ML (SYRINGE) ×3 IMPLANT
SYR CONTROL 10ML LL (SYRINGE) IMPLANT
TOWEL OR 17X24 6PK STRL BLUE (TOWEL DISPOSABLE) ×6 IMPLANT
TOWEL OR 17X26 10 PK STRL BLUE (TOWEL DISPOSABLE) ×3 IMPLANT
WATER STERILE IRR 1000ML POUR (IV SOLUTION) ×3 IMPLANT

## 2017-03-17 NOTE — Op Note (Signed)
Operative report.  Preoperative diagnosis failed spinal cord stimulator battery.   Postoperative diagnosis same.  Operative procedure. Removal of spinal cord stimulator battery and implantation of new spinal cord stimulator battery.  Complications. None.  Implant system used [employer] Intellis spinal cord stimulator battery.   TYRX antibiotic pouch  Indications. 41 year old gentleman who had a previous spinal cord stimulator implanted several years ago and has done exceptionally well with it. The battery has since failed and he is no longer getting the pain relief that it provided. As a result to return to my office for further evaluation and treatment. Interrogation of the battery confirm that it failed and so we elected to replace it. All appropriate risks benefits and alternatives to surgery were discussed with the patient and consent was obtained.  Operative note. Patient was brought the operating room placed on the operating room table. After successful induction of general anesthesia and endotracheal intubation teds SCDs were applied and he was turned into a lateral decubitus position. Axillary roll was placed all bony prominences well-padded and the beanbag was inflated to hold the patient in position.  Timeout was taken to confirm patient procedure and all other important data. The proposed incision site was infiltrated with quarter percent Marcaine with Exparel. Incision was made in the battery was delivered out of the body. The leads were then directly tested to confirm that they were functioning appropriately. Once this was determined the new battery was opened. The pouch was then created inferiorly and the battery was placed into the antibiotic pouch and the leads were connected and attached according manufacturer's standards. The batteries and placed into the pouch and the leads were then tested again and I confirm that the battery and the leads were functioning appropriately. The battery  is then secured to the deep fascia with interrupted #1 Vicryl sutures and the wound was copiously irrigated with normal saline. The wound was then closed in a layered fashion with interrupted #1 Vicryl suture, 2-0 Vicryl suture, and 3-0 Monocryl. Steri-Strips and a dry dressing were applied. The remainder of the cord percent Marcaine with Exparel was used for postoperative analgesia prior to placing the dressing.  Patient will be discharged home from the PACU once he meets all appropriate criteria. He'll follow up in 2 weeks for wound evaluation. Patient was given a prescription for Percocet 5/325 one by mouth Q8 when necessary, as well as's Robaxin 500 mg 1 by mouth 3 times a day when necessary.

## 2017-03-17 NOTE — Transfer of Care (Signed)
Immediate Anesthesia Transfer of Care Note  Patient: [name]  Procedure(s) Performed: Procedure(s) with comments: SPINAL CORD STIMULATOR BATTERY EXCHANGE (N/A) - 60 mins  Patient Location: PACU  Anesthesia Type:General  Level of Consciousness:  sedated, patient cooperative and responds to stimulation  Airway & Oxygen Therapy:Patient Spontanous Breathing and Patient connected to face mask oxgen  Post-op Assessment:  Report given to PACU RN and Post -op Vital signs reviewed and stable  Post vital signs:  Reviewed and stable  Last Vitals:  Vitals:   03/17/17 0703  BP: 135/86  Pulse: 90  Resp: 20  Temp: 36.9 C  SpO2: 99%    Complications: No apparent anesthesia complications

## 2017-03-17 NOTE — H&P (Signed)
History of Present Illness The patient is a 41 year old male who comes in today for a preoperative History and Physical. The patient is scheduled for a SCS Battery Exchange to be performed by Dr. Debria Garret D. Shon Baton, MD at [facility] on 03/17/17 . The pt reports smoking almost a pack a day. He says he has not been able to stop. Otherwise the pt reports a hx of good health.   Problem List/Past Medical History of lumbosacral spine surgery (G95.621)  Post-laminectomy Syndrome, Lumbar (722.83)  Pain, Lumbar (LBP) (724.2)  Malfunction of spinal cord stimulator, initial encounter (T85.192A)  Chronic pain syndrome (338.4)  Problems Reconciled   Allergies  PENCILLIN  Allergies Reconciled   Family History Cancer  grandmother fathers side Chronic Obstructive Lung Disease  grandmother mothers side Heart Disease  father Heart disease in male family member before age 57  Hypertension  father Osteoarthritis  grandmother mothers side  Social History  Tobacco / smoke exposure  Daily. Tobacco use  Current every day smoker. Drug/Alcohol Rehab (Currently)  no Exercise  Exercises never Children  0 Alcohol use  Formerly drank alcohol. current drinker; 15 or more per week Current work status  disabled Illicit drug use  no Living situation  live with partner Marital status  partnered Most recent primary occupation  electrician Pain Contract  no Previously in rehab  no  Medication History TraMADol HCl (  Tablet, Oral) Active. (#2 bid and #2 qhs Rx'd by [facility] in [place]) Gabapentin (  Capsule, Oral) Active. (tid) Medications Reconciled  Vitals  03/05/2017 12:49 PM Weight: 156 lb Height: 70.75in Body Surface Area: 1.89 m Body Mass Index: 21.91 kg/m  Temp.: 98.22F  Pulse: 88 (Regular)  BP: 166/101 (Sitting, Right Arm, Standard)   General Appearance-Not in acute distress. Orientation-Oriented X3. Build & Nutrition-Well  nourished and Well developed.  Integumentary General Characteristics Surgical Scars - surgical scarring consistent with previous lumbar surgery. Lumbar Spine-Skin examination of the lumbar spine is without deformity, skin lesions, lacerations or abrasions.  Chest and Lung Exam Auscultation Breath sounds - Normal and Clear.  Cardiovascular Auscultation Rhythm - Regular rate and rhythm.  Abdomen Palpation/Percussion Palpation and Percussion of the abdomen reveal - Soft, Non Tender and No Rebound tenderness.  Peripheral Vascular Lower Extremity Palpation - Posterior tibial pulse - Bilateral - 2+. Dorsalis pedis pulse - Bilateral - 2+.  Neurologic Sensation Lower Extremity - Left - sensation is diminished in the lower extremity. Right - sensation is intact in the lower extremity. Reflexes Patellar Reflex - Bilateral - 2+. Achilles Reflex - Bilateral - 2+. Clonus - Bilateral - clonus not present. Testing Seated Straight Leg Raise - Bilateral - Seated straight leg raise negative.  Musculoskeletal Spine/Ribs/Pelvis  Lumbosacral Spine: Inspection and Palpation - Tenderness - left lumbar paraspinals tender to palpation and right lumbar paraspinals tender to palpation. Strength and Tone: Strength - Hip Flexion - Bilateral - 5/5. Knee Extension - Bilateral - 5/5. Knee Flexion - Bilateral - 5/5. Ankle Dorsiflexion - Bilateral - 5/5. Ankle Plantarflexion - Bilateral - 5/5. Heel walk - Bilateral - able to heel walk without difficulty. Toe Walk - Bilateral - able to walk on toes with moderate difficulty. Heel-Toe Walk - Bilateral - able to heel-toe walk without difficulty. ROM - Flexion - moderately decreased range of motion and painful. Extension - moderately decreased range of motion and painful. Left Lateral Bending - moderately decreased range of motion and painful. Right Lateral Bending - moderately decreased range of motion and painful. Right Rotation -  moderately decreased range of  motion and painful. Left Rotation - moderately decreased range of motion and painful. Pain - neither flexion or extension is more painful than the other. Lumbosacral Spine - Waddell's Signs - no Waddell's signs present. Lower Extremity Range of Motion - No true hip, knee or ankle pain with range of motion. Gait and Station - [employer] - no assistive devices.   At this point time the patient states that his battery is no longer functioning. When he was functionally he had excellent relief of his radicular leg pain and back pain. He returns now from living in [place] and would really like to have the battery replaced. The battery itself was placed in 2014. At this point we will contact the [employer] rep and set up surgical date to have the battery exchanged.  Risks were explained. These include infection, bleeding, death, stroke, paralysis, damage to the leads, battery site placement pain. Need for additional surgery.  They've been no changes in his clinical exam since last office visit.

## 2017-03-17 NOTE — Anesthesia Postprocedure Evaluation (Signed)
Anesthesia Post Note  Patient: [name]  Procedure(s) Performed: SPINAL CORD STIMULATOR BATTERY EXCHANGE (N/A Back)     Patient location during evaluation: PACU Anesthesia Type: General Level of consciousness: awake Pain management: pain level controlled Vital Signs Assessment: post-procedure vital signs reviewed and stable Respiratory status: spontaneous breathing Cardiovascular status: stable Postop Assessment: no apparent nausea or vomiting Anesthetic complications: no    Last Vitals:  Vitals:   03/17/17 1008 03/17/17 1018  BP: (!) 122/93 119/87  Pulse: 81 81  Resp: 16 12  Temp:  36.5 C  SpO2: 100% 100%    Last Pain:  Vitals:   03/17/17 0959  TempSrc:   PainSc: 4    Pain Goal: Patients Stated Pain Goal: 6 (03/17/17 0719)  LLE Motor Response: Purposeful movement;Responds to commands (03/17/17 1018) LLE Sensation: Full sensation (03/17/17 1018) RLE Motor Response: Purposeful movement;Responds to commands (03/17/17 1018) RLE Sensation: Full sensation (03/17/17 1018)      Ramiz Turpin JR,JOHN Jnae [place]

## 2017-03-17 NOTE — Brief Op Note (Signed)
03/17/2017  9:41 AM  PATIENT:  [name]  41 y.o. male  PRE-OPERATIVE DIAGNOSIS:  Failed spinal cord stimulator battery  POST-OPERATIVE DIAGNOSIS:  Failed spinal cord stimulator battery  PROCEDURE:  Procedure(s) with comments: SPINAL CORD STIMULATOR BATTERY EXCHANGE (N/A) - 60 mins  SURGEON:  Surgeon(s) and Role:    Venita Lick, MD - Primary  PHYSICIAN ASSISTANT:   ASSISTANTS: none   ANESTHESIA:   general  EBL:  Total I/O In: 1000 [I.V.:1000] Out: 20 [Blood:20]  BLOOD ADMINISTERED:none  DRAINS: none   LOCAL MEDICATIONS USED:  MARCAINE    and OTHER exparil  SPECIMEN:  Source of Specimen:  SCS Battery  DISPOSITION OF SPECIMEN:  N/A  COUNTS:  YES  TOURNIQUET:  * No tourniquets in log *  DICTATION: .Dragon Dictation  PLAN OF CARE: Discharge to home after PACU  PATIENT DISPOSITION:  PACU - hemodynamically stable.

## 2017-03-17 NOTE — Anesthesia Procedure Notes (Signed)
Procedure Name: Intubation Date/Time: 03/17/2017 8:42 AM Performed by: Jhonnie Garner Pre-anesthesia Checklist: Patient identified, Emergency Drugs available, Suction available and Patient being monitored Patient Re-evaluated:Patient Re-evaluated prior to induction Oxygen Delivery Method: Circle system utilized Preoxygenation: Pre-oxygenation with 100% oxygen Induction Type: IV induction Ventilation: Mask ventilation without difficulty Laryngoscope Size: Miller and 2 Grade View: Grade I Tube type: Oral Tube size: 8.0 mm Number of attempts: 1 Airway Equipment and Method: Stylet Placement Confirmation: ETT inserted through vocal cords under direct vision,  positive ETCO2 and breath sounds checked- equal and bilateral Secured at: 22 cm Tube secured with: Tape Dental Injury: Teeth and Oropharynx as per pre-operative assessment

## 2017-03-17 NOTE — Discharge Instructions (Signed)
Ok to shower in 5 days Call to set up follow up

## 2017-03-18 ENCOUNTER — Encounter (HOSPITAL_COMMUNITY): Payer: Self-pay | Admitting: Orthopedic Surgery
